# Patient Record
Sex: Male | Born: 1950
Health system: Southern US, Community
[De-identification: ages and names within clinical notes are randomized; demographics above are authoritative.]

## PROBLEM LIST (undated history)

## (undated) DIAGNOSIS — N400 Enlarged prostate without lower urinary tract symptoms: Secondary | ICD-10-CM

## (undated) DIAGNOSIS — J449 Chronic obstructive pulmonary disease, unspecified: Secondary | ICD-10-CM

## (undated) DIAGNOSIS — C801 Malignant (primary) neoplasm, unspecified: Secondary | ICD-10-CM

## (undated) DIAGNOSIS — E119 Type 2 diabetes mellitus without complications: Secondary | ICD-10-CM

## (undated) DIAGNOSIS — I10 Essential (primary) hypertension: Secondary | ICD-10-CM

## (undated) DIAGNOSIS — J302 Other seasonal allergic rhinitis: Secondary | ICD-10-CM

## (undated) DIAGNOSIS — G4730 Sleep apnea, unspecified: Secondary | ICD-10-CM

## (undated) HISTORY — PX: TONSILLECTOMY: SUR1361

## (undated) HISTORY — PX: APPENDECTOMY: SHX54

## (undated) HISTORY — DX: Benign prostatic hyperplasia without lower urinary tract symptoms: N40.0

---

## 2015-07-24 DIAGNOSIS — E119 Type 2 diabetes mellitus without complications: Secondary | ICD-10-CM | POA: Diagnosis not present

## 2015-07-24 DIAGNOSIS — E785 Hyperlipidemia, unspecified: Secondary | ICD-10-CM | POA: Diagnosis not present

## 2015-07-24 DIAGNOSIS — I10 Essential (primary) hypertension: Secondary | ICD-10-CM | POA: Diagnosis not present

## 2015-07-29 DIAGNOSIS — G4733 Obstructive sleep apnea (adult) (pediatric): Secondary | ICD-10-CM | POA: Diagnosis not present

## 2015-07-29 DIAGNOSIS — E669 Obesity, unspecified: Secondary | ICD-10-CM | POA: Diagnosis not present

## 2015-07-29 DIAGNOSIS — E119 Type 2 diabetes mellitus without complications: Secondary | ICD-10-CM | POA: Diagnosis not present

## 2015-07-29 DIAGNOSIS — N400 Enlarged prostate without lower urinary tract symptoms: Secondary | ICD-10-CM | POA: Diagnosis not present

## 2015-07-29 DIAGNOSIS — J309 Allergic rhinitis, unspecified: Secondary | ICD-10-CM | POA: Diagnosis not present

## 2015-07-29 DIAGNOSIS — I10 Essential (primary) hypertension: Secondary | ICD-10-CM | POA: Diagnosis not present

## 2015-08-18 DIAGNOSIS — G4733 Obstructive sleep apnea (adult) (pediatric): Secondary | ICD-10-CM | POA: Diagnosis not present

## 2015-12-02 DIAGNOSIS — I10 Essential (primary) hypertension: Secondary | ICD-10-CM | POA: Diagnosis not present

## 2015-12-02 DIAGNOSIS — E119 Type 2 diabetes mellitus without complications: Secondary | ICD-10-CM | POA: Diagnosis not present

## 2015-12-04 DIAGNOSIS — N400 Enlarged prostate without lower urinary tract symptoms: Secondary | ICD-10-CM | POA: Diagnosis not present

## 2015-12-04 DIAGNOSIS — Z6838 Body mass index (BMI) 38.0-38.9, adult: Secondary | ICD-10-CM | POA: Diagnosis not present

## 2015-12-04 DIAGNOSIS — G4733 Obstructive sleep apnea (adult) (pediatric): Secondary | ICD-10-CM | POA: Diagnosis not present

## 2015-12-04 DIAGNOSIS — I10 Essential (primary) hypertension: Secondary | ICD-10-CM | POA: Diagnosis not present

## 2015-12-04 DIAGNOSIS — J309 Allergic rhinitis, unspecified: Secondary | ICD-10-CM | POA: Diagnosis not present

## 2015-12-04 DIAGNOSIS — E6609 Other obesity due to excess calories: Secondary | ICD-10-CM | POA: Diagnosis not present

## 2015-12-04 DIAGNOSIS — E119 Type 2 diabetes mellitus without complications: Secondary | ICD-10-CM | POA: Diagnosis not present

## 2015-12-30 ENCOUNTER — Ambulatory Visit (HOSPITAL_COMMUNITY)
Admission: RE | Admit: 2015-12-30 | Discharge: 2015-12-30 | Disposition: A | Discharge status: Home / Self Care | Payer: Medicare PPO | Source: Ambulatory Visit | Attending: Internal Medicine | Admitting: Internal Medicine

## 2015-12-30 ENCOUNTER — Other Ambulatory Visit (HOSPITAL_COMMUNITY): Payer: Self-pay | Admitting: Internal Medicine

## 2015-12-30 ENCOUNTER — Encounter (HOSPITAL_COMMUNITY)
Admission: EM | Disposition: A | Discharge status: Home / Self Care | Payer: Self-pay | Source: Home / Self Care | Attending: Neurosurgery

## 2015-12-30 ENCOUNTER — Encounter (HOSPITAL_COMMUNITY): Payer: Self-pay

## 2015-12-30 ENCOUNTER — Other Ambulatory Visit: Payer: Self-pay

## 2015-12-30 ENCOUNTER — Inpatient Hospital Stay (HOSPITAL_COMMUNITY)
Admission: EM | Admit: 2015-12-30 | Discharge: 2016-01-01 | DRG: 027 | Disposition: A | Discharge status: Home / Self Care | Payer: Medicare PPO | Attending: Neurosurgery | Admitting: Neurosurgery

## 2015-12-30 ENCOUNTER — Inpatient Hospital Stay (HOSPITAL_COMMUNITY): Payer: Medicare PPO | Admitting: Critical Care Medicine

## 2015-12-30 DIAGNOSIS — Z23 Encounter for immunization: Secondary | ICD-10-CM

## 2015-12-30 DIAGNOSIS — G935 Compression of brain: Secondary | ICD-10-CM

## 2015-12-30 DIAGNOSIS — R51 Headache: Secondary | ICD-10-CM | POA: Diagnosis not present

## 2015-12-30 DIAGNOSIS — R531 Weakness: Secondary | ICD-10-CM | POA: Insufficient documentation

## 2015-12-30 DIAGNOSIS — R410 Disorientation, unspecified: Secondary | ICD-10-CM

## 2015-12-30 DIAGNOSIS — E119 Type 2 diabetes mellitus without complications: Secondary | ICD-10-CM | POA: Diagnosis not present

## 2015-12-30 DIAGNOSIS — Z029 Encounter for administrative examinations, unspecified: Secondary | ICD-10-CM

## 2015-12-30 DIAGNOSIS — E669 Obesity, unspecified: Secondary | ICD-10-CM | POA: Diagnosis not present

## 2015-12-30 DIAGNOSIS — Z6836 Body mass index (BMI) 36.0-36.9, adult: Secondary | ICD-10-CM | POA: Diagnosis not present

## 2015-12-30 DIAGNOSIS — I6201 Nontraumatic acute subdural hemorrhage: Principal | ICD-10-CM | POA: Diagnosis present

## 2015-12-30 DIAGNOSIS — I10 Essential (primary) hypertension: Secondary | ICD-10-CM | POA: Diagnosis not present

## 2015-12-30 DIAGNOSIS — I6202 Nontraumatic subacute subdural hemorrhage: Secondary | ICD-10-CM

## 2015-12-30 DIAGNOSIS — S065X9A Traumatic subdural hemorrhage with loss of consciousness of unspecified duration, initial encounter: Secondary | ICD-10-CM | POA: Diagnosis present

## 2015-12-30 DIAGNOSIS — R519 Headache, unspecified: Secondary | ICD-10-CM

## 2015-12-30 DIAGNOSIS — S065XAA Traumatic subdural hemorrhage with loss of consciousness status unknown, initial encounter: Secondary | ICD-10-CM | POA: Diagnosis present

## 2015-12-30 DIAGNOSIS — R402412 Glasgow coma scale score 13-15, at arrival to emergency department: Secondary | ICD-10-CM | POA: Diagnosis not present

## 2015-12-30 DIAGNOSIS — I6200 Nontraumatic subdural hemorrhage, unspecified: Secondary | ICD-10-CM | POA: Diagnosis not present

## 2015-12-30 DIAGNOSIS — Z09 Encounter for follow-up examination after completed treatment for conditions other than malignant neoplasm: Secondary | ICD-10-CM

## 2015-12-30 DIAGNOSIS — Z0189 Encounter for other specified special examinations: Secondary | ICD-10-CM | POA: Insufficient documentation

## 2015-12-30 DIAGNOSIS — Z7984 Long term (current) use of oral hypoglycemic drugs: Secondary | ICD-10-CM | POA: Diagnosis not present

## 2015-12-30 DIAGNOSIS — R1110 Vomiting, unspecified: Secondary | ICD-10-CM | POA: Diagnosis not present

## 2015-12-30 HISTORY — DX: Chronic obstructive pulmonary disease, unspecified: J44.9

## 2015-12-30 HISTORY — DX: Sleep apnea, unspecified: G47.30

## 2015-12-30 HISTORY — DX: Essential (primary) hypertension: I10

## 2015-12-30 HISTORY — DX: Type 2 diabetes mellitus without complications: E11.9

## 2015-12-30 HISTORY — DX: Other seasonal allergic rhinitis: J30.2

## 2015-12-30 HISTORY — PX: CRANIOTOMY: SHX93

## 2015-12-30 LAB — BASIC METABOLIC PANEL
ANION GAP: 11 (ref 5–15)
BUN: 12 mg/dL (ref 6–20)
CHLORIDE: 105 mmol/L (ref 101–111)
CO2: 26 mmol/L (ref 22–32)
Calcium: 9.3 mg/dL (ref 8.9–10.3)
Creatinine, Ser: 0.86 mg/dL (ref 0.61–1.24)
GFR calc non Af Amer: >60 mL/min (ref >60)
Glucose, Bld: 159 mg/dL — ABNORMAL HIGH (ref 65–99)
Potassium: 4 mmol/L (ref 3.5–5.1)
Sodium: 142 mmol/L (ref 135–145)

## 2015-12-30 LAB — CBC WITH DIFFERENTIAL/PLATELET
BASOS ABS: 0 10*3/uL (ref 0.0–0.1)
Basophils Relative: 0 %
EOS ABS: 0.1 10*3/uL (ref 0.0–0.7)
EOS PCT: 1 %
HCT: 40.8 % (ref 39.0–52.0)
HEMOGLOBIN: 12.9 g/dL — AB (ref 13.0–17.0)
Lymphocytes Relative: 39 %
Lymphs Abs: 4.2 10*3/uL — ABNORMAL HIGH (ref 0.7–4.0)
MCH: 26.8 pg (ref 26.0–34.0)
MCHC: 31.6 g/dL (ref 30.0–36.0)
MCV: 84.8 fL (ref 78.0–100.0)
Monocytes Absolute: 0.5 10*3/uL (ref 0.1–1.0)
Monocytes Relative: 4 %
Neutro Abs: 5.9 10*3/uL (ref 1.7–7.7)
Neutrophils Relative %: 56 %
PLATELETS: 268 10*3/uL (ref 150–400)
RBC: 4.81 MIL/uL (ref 4.22–5.81)
RDW: 14.4 % (ref 11.5–15.5)
WBC: 10.6 10*3/uL — AB (ref 4.0–10.5)

## 2015-12-30 LAB — TYPE AND SCREEN
ABO/RH(D): O POS
Antibody Screen: NEGATIVE

## 2015-12-30 LAB — ABO/RH: ABO/RH(D): O POS

## 2015-12-30 LAB — PROTIME-INR
INR: 1.14 (ref 0.00–1.49)
Prothrombin Time: 14.8 seconds (ref 11.6–15.2)

## 2015-12-30 LAB — MRSA PCR SCREENING: MRSA BY PCR: POSITIVE — AB

## 2015-12-30 LAB — CBG MONITORING, ED: GLUCOSE-CAPILLARY: 142 mg/dL — AB (ref 65–99)

## 2015-12-30 LAB — TROPONIN I

## 2015-12-30 LAB — POCT I-STAT CREATININE: Creatinine, Ser: 0.9 mg/dL (ref 0.61–1.24)

## 2015-12-30 SURGERY — CRANIOTOMY HEMATOMA EVACUATION SUBDURAL
Anesthesia: General | Site: Head | Laterality: Left

## 2015-12-30 MED ORDER — ESMOLOL HCL 100 MG/10ML IV SOLN
INTRAVENOUS | Status: DC | PRN
  Administered 2015-12-30: 50 mg via INTRAVENOUS

## 2015-12-30 MED ORDER — FENTANYL CITRATE (PF) 250 MCG/5ML IJ SOLN
INTRAMUSCULAR | Status: AC
  Filled 2015-12-30: qty 5

## 2015-12-30 MED ORDER — PROPOFOL 10 MG/ML IV BOLUS
INTRAVENOUS | Status: AC
  Filled 2015-12-30: qty 20

## 2015-12-30 MED ORDER — PANTOPRAZOLE SODIUM 40 MG IV SOLR
40.0000 mg | Freq: Every day | INTRAVENOUS | Status: DC
  Administered 2015-12-30 – 2015-12-31 (×2): 40 mg via INTRAVENOUS
  Filled 2015-12-30 (×2): qty 40

## 2015-12-30 MED ORDER — ROCURONIUM BROMIDE 100 MG/10ML IV SOLN
INTRAVENOUS | Status: DC | PRN
  Administered 2015-12-30: 20 mg via INTRAVENOUS
  Administered 2015-12-30: 50 mg via INTRAVENOUS

## 2015-12-30 MED ORDER — HYDROMORPHONE HCL 1 MG/ML IJ SOLN
0.5000 mg | INTRAMUSCULAR | Status: DC | PRN
  Administered 2015-12-30 – 2015-12-31 (×3): 1 mg via INTRAVENOUS
  Administered 2015-12-31: 0.5 mg via INTRAVENOUS
  Filled 2015-12-30 (×4): qty 1

## 2015-12-30 MED ORDER — OXYCODONE HCL 5 MG/5ML PO SOLN: 5.0000 mg | Freq: Once | ORAL | Status: DC | PRN

## 2015-12-30 MED ORDER — SUGAMMADEX SODIUM 200 MG/2ML IV SOLN
INTRAVENOUS | Status: DC | PRN
  Administered 2015-12-30: 200 mg via INTRAVENOUS

## 2015-12-30 MED ORDER — OXYCODONE HCL 5 MG PO TABS: 5.0000 mg | ORAL_TABLET | Freq: Once | ORAL | Status: DC | PRN

## 2015-12-30 MED ORDER — IOHEXOL 300 MG/ML  SOLN: 75.0000 mL | Freq: Once | INTRAMUSCULAR | Status: DC | PRN

## 2015-12-30 MED ORDER — FLUTICASONE PROPIONATE 50 MCG/ACT NA SUSP
1.0000 | Freq: Every day | NASAL | Status: DC
  Administered 2016-01-01: 1 via NASAL
  Filled 2015-12-30: qty 16

## 2015-12-30 MED ORDER — METFORMIN HCL 500 MG PO TABS: 1000.0000 mg | ORAL_TABLET | Freq: Every day | ORAL | Status: DC

## 2015-12-30 MED ORDER — ONDANSETRON HCL 4 MG/2ML IJ SOLN
4.0000 mg | Freq: Four times a day (QID) | INTRAMUSCULAR | Status: AC | PRN
  Administered 2015-12-30: 4 mg via INTRAVENOUS

## 2015-12-30 MED ORDER — LABETALOL HCL 5 MG/ML IV SOLN: 10.0000 mg | INTRAVENOUS | Status: DC | PRN

## 2015-12-30 MED ORDER — PROPOFOL 10 MG/ML IV BOLUS
INTRAVENOUS | Status: DC | PRN
  Administered 2015-12-30: 150 mg via INTRAVENOUS
  Administered 2015-12-30: 30 mg via INTRAVENOUS

## 2015-12-30 MED ORDER — SODIUM CHLORIDE 0.9 % IV SOLN
500.0000 mg | Freq: Two times a day (BID) | INTRAVENOUS | Status: DC
  Administered 2015-12-30 – 2016-01-01 (×4): 500 mg via INTRAVENOUS
  Filled 2015-12-30 (×5): qty 5

## 2015-12-30 MED ORDER — LIDOCAINE HCL (CARDIAC) 20 MG/ML IV SOLN
INTRAVENOUS | Status: DC | PRN
  Administered 2015-12-30: 100 mg via INTRAVENOUS

## 2015-12-30 MED ORDER — FENTANYL CITRATE (PF) 100 MCG/2ML IJ SOLN
INTRAMUSCULAR | Status: DC | PRN
  Administered 2015-12-30: 150 ug via INTRAVENOUS
  Administered 2015-12-30 (×3): 50 ug via INTRAVENOUS

## 2015-12-30 MED ORDER — LIDOCAINE-EPINEPHRINE 1 %-1:100000 IJ SOLN
INTRAMUSCULAR | Status: DC | PRN
  Administered 2015-12-30: 5 mL

## 2015-12-30 MED ORDER — TAMSULOSIN HCL 0.4 MG PO CAPS
0.4000 mg | ORAL_CAPSULE | Freq: Every day | ORAL | Status: DC
  Administered 2015-12-31 – 2016-01-01 (×2): 0.4 mg via ORAL
  Filled 2015-12-30 (×2): qty 1

## 2015-12-30 MED ORDER — SODIUM CHLORIDE 0.9 % IV SOLN
INTRAVENOUS | Status: DC | PRN
  Administered 2015-12-30: 16:00:00 via INTRAVENOUS

## 2015-12-30 MED ORDER — HYDROCHLOROTHIAZIDE 12.5 MG PO CAPS
12.5000 mg | ORAL_CAPSULE | Freq: Every day | ORAL | Status: DC
  Administered 2015-12-31 – 2016-01-01 (×2): 12.5 mg via ORAL
  Filled 2015-12-30 (×2): qty 1

## 2015-12-30 MED ORDER — VANCOMYCIN HCL 10 G IV SOLR
1250.0000 mg | Freq: Two times a day (BID) | INTRAVENOUS | Status: AC
  Administered 2015-12-31 (×2): 1250 mg via INTRAVENOUS
  Filled 2015-12-30 (×2): qty 1250

## 2015-12-30 MED ORDER — ONDANSETRON HCL 4 MG/2ML IJ SOLN
INTRAMUSCULAR | Status: AC
  Filled 2015-12-30: qty 2

## 2015-12-30 MED ORDER — HYDROCODONE-ACETAMINOPHEN 5-325 MG PO TABS
1.0000 | ORAL_TABLET | ORAL | Status: DC | PRN
  Administered 2015-12-31 – 2016-01-01 (×3): 1 via ORAL
  Filled 2015-12-30 (×3): qty 1

## 2015-12-30 MED ORDER — SODIUM CHLORIDE 0.9 % IV SOLN
INTRAVENOUS | Status: DC
  Administered 2015-12-30 (×2): via INTRAVENOUS

## 2015-12-30 MED ORDER — 0.9 % SODIUM CHLORIDE (POUR BTL) OPTIME
TOPICAL | Status: DC | PRN
  Administered 2015-12-30 (×2): 1000 mL

## 2015-12-30 MED ORDER — ONDANSETRON HCL 4 MG PO TABS: 4.0000 mg | ORAL_TABLET | ORAL | Status: DC | PRN

## 2015-12-30 MED ORDER — ACETAMINOPHEN 325 MG PO TABS: 650.0000 mg | ORAL_TABLET | ORAL | Status: DC | PRN

## 2015-12-30 MED ORDER — ACETAMINOPHEN 650 MG RE SUPP: 650.0000 mg | RECTAL | Status: DC | PRN

## 2015-12-30 MED ORDER — PHENYLEPHRINE HCL 10 MG/ML IJ SOLN
10.0000 mg | INTRAMUSCULAR | Status: DC | PRN
  Administered 2015-12-30: 50 ug/min via INTRAVENOUS

## 2015-12-30 MED ORDER — HYDROMORPHONE HCL 1 MG/ML IJ SOLN: 0.2500 mg | INTRAMUSCULAR | Status: DC | PRN

## 2015-12-30 MED ORDER — POTASSIUM CHLORIDE IN NACL 20-0.9 MEQ/L-% IV SOLN
INTRAVENOUS | Status: DC
  Administered 2015-12-30 – 2016-01-01 (×3): via INTRAVENOUS
  Filled 2015-12-30 (×4): qty 1000

## 2015-12-30 MED ORDER — SUGAMMADEX SODIUM 200 MG/2ML IV SOLN
INTRAVENOUS | Status: AC
  Filled 2015-12-30: qty 2

## 2015-12-30 MED ORDER — SODIUM CHLORIDE 0.9 % IR SOLN
Status: DC | PRN
  Administered 2015-12-30: 17:00:00

## 2015-12-30 MED ORDER — PNEUMOCOCCAL VAC POLYVALENT 25 MCG/0.5ML IJ INJ
0.5000 mL | INJECTION | INTRAMUSCULAR | Status: AC
  Administered 2015-12-31: 0.5 mL via INTRAMUSCULAR
  Filled 2015-12-30: qty 0.5

## 2015-12-30 MED ORDER — THROMBIN 20000 UNITS EX SOLR
CUTANEOUS | Status: DC | PRN
  Administered 2015-12-30: 17:00:00 via TOPICAL

## 2015-12-30 MED ORDER — LORATADINE 10 MG PO TABS
10.0000 mg | ORAL_TABLET | Freq: Every day | ORAL | Status: DC
  Administered 2016-01-01: 10 mg via ORAL
  Filled 2015-12-30 (×2): qty 1

## 2015-12-30 MED ORDER — ONDANSETRON HCL 4 MG/2ML IJ SOLN
4.0000 mg | INTRAMUSCULAR | Status: DC | PRN
  Administered 2015-12-31: 4 mg via INTRAVENOUS
  Filled 2015-12-30: qty 2

## 2015-12-30 MED ORDER — PROMETHAZINE HCL 25 MG PO TABS
12.5000 mg | ORAL_TABLET | ORAL | Status: DC | PRN
  Administered 2015-12-31: 12.5 mg via ORAL
  Filled 2015-12-30: qty 1

## 2015-12-30 MED ORDER — VANCOMYCIN HCL IN DEXTROSE 1-5 GM/200ML-% IV SOLN
INTRAVENOUS | Status: AC
  Administered 2015-12-30: 1000 mg via INTRAVENOUS
  Filled 2015-12-30: qty 200

## 2015-12-30 SURGICAL SUPPLY — 67 items
BAG DECANTER FOR FLEXI CONT (MISCELLANEOUS) ×3 IMPLANT
BANDAGE GAUZE 4  KLING STR (GAUZE/BANDAGES/DRESSINGS) IMPLANT
BIT DRILL WIRE PASS 1.3MM (BIT) ×1 IMPLANT
BLADE CLIPPER SURG (BLADE) IMPLANT
BLADE SURG 11 STRL SS (BLADE) ×3 IMPLANT
BNDG COHESIVE 4X5 TAN NS LF (GAUZE/BANDAGES/DRESSINGS) IMPLANT
BRUSH SCRUB EZ PLAIN DRY (MISCELLANEOUS) ×3 IMPLANT
BUR ACORN 9.0 PRECISION (BURR) ×2 IMPLANT
BUR ACORN 9.0MM PRECISION (BURR) ×1
BUR SPIRAL ROUTER 2.3 (BUR) IMPLANT
BUR SPIRAL ROUTER 2.3MM (BUR)
CANISTER SUCT 3000ML PPV (MISCELLANEOUS) ×3 IMPLANT
CATH ROBINSON RED A/P 14FR (CATHETERS) ×3 IMPLANT
CLIP TI MEDIUM 6 (CLIP) IMPLANT
COVER BACK TABLE 60X90IN (DRAPES) ×6 IMPLANT
DECANTER SPIKE VIAL GLASS SM (MISCELLANEOUS) ×3 IMPLANT
DRAIN CHANNEL 10M FLAT 3/4 FLT (DRAIN) ×3 IMPLANT
DRAPE NEUROLOGICAL W/INCISE (DRAPES) ×3 IMPLANT
DRAPE SURG 17X23 STRL (DRAPES) IMPLANT
DRAPE WARM FLUID 44X44 (DRAPE) ×3 IMPLANT
DRILL WIRE PASS 1.3MM (BIT) ×3
DRSG OPSITE 4X5.5 SM (GAUZE/BANDAGES/DRESSINGS) ×6 IMPLANT
ELECT CAUTERY BLADE 6.4 (BLADE) ×3 IMPLANT
ELECT REM PT RETURN 9FT ADLT (ELECTROSURGICAL) ×3
ELECTRODE REM PT RTRN 9FT ADLT (ELECTROSURGICAL) ×1 IMPLANT
EVACUATOR SILICONE 100CC (DRAIN) ×3 IMPLANT
GAUZE SPONGE 4X4 12PLY STRL (GAUZE/BANDAGES/DRESSINGS) ×3 IMPLANT
GAUZE SPONGE 4X4 16PLY XRAY LF (GAUZE/BANDAGES/DRESSINGS) IMPLANT
GLOVE BIO SURGEON STRL SZ8 (GLOVE) ×3 IMPLANT
GLOVE EXAM NITRILE LRG STRL (GLOVE) IMPLANT
GLOVE EXAM NITRILE MD LF STRL (GLOVE) IMPLANT
GLOVE EXAM NITRILE XL STR (GLOVE) IMPLANT
GLOVE EXAM NITRILE XS STR PU (GLOVE) IMPLANT
GLOVE INDICATOR 8.5 STRL (GLOVE) ×3 IMPLANT
GOWN STRL REUS W/ TWL LRG LVL3 (GOWN DISPOSABLE) ×1 IMPLANT
GOWN STRL REUS W/ TWL XL LVL3 (GOWN DISPOSABLE) ×1 IMPLANT
GOWN STRL REUS W/TWL 2XL LVL3 (GOWN DISPOSABLE) ×3 IMPLANT
GOWN STRL REUS W/TWL LRG LVL3 (GOWN DISPOSABLE) ×2
GOWN STRL REUS W/TWL XL LVL3 (GOWN DISPOSABLE) ×2
HEMOSTAT SURGICEL 2X14 (HEMOSTASIS) IMPLANT
KIT BASIN OR (CUSTOM PROCEDURE TRAY) ×3 IMPLANT
KIT ROOM TURNOVER OR (KITS) ×3 IMPLANT
LIQUID BAND (GAUZE/BANDAGES/DRESSINGS) ×3 IMPLANT
NEEDLE HYPO 25X1 1.5 SAFETY (NEEDLE) ×3 IMPLANT
NS IRRIG 1000ML POUR BTL (IV SOLUTION) ×6 IMPLANT
PACK CRANIOTOMY (CUSTOM PROCEDURE TRAY) ×3 IMPLANT
PAD ARMBOARD 7.5X6 YLW CONV (MISCELLANEOUS) ×9 IMPLANT
PAD EYE OVAL STERILE LF (GAUZE/BANDAGES/DRESSINGS) IMPLANT
PATTIES SURGICAL .25X.25 (GAUZE/BANDAGES/DRESSINGS) IMPLANT
PATTIES SURGICAL .5 X.5 (GAUZE/BANDAGES/DRESSINGS) IMPLANT
PATTIES SURGICAL .5 X3 (DISPOSABLE) IMPLANT
PATTIES SURGICAL 1X1 (DISPOSABLE) IMPLANT
PIN MAYFIELD SKULL DISP (PIN) IMPLANT
SPONGE NEURO XRAY DETECT 1X3 (DISPOSABLE) IMPLANT
SPONGE SURGIFOAM ABS GEL 100 (HEMOSTASIS) ×3 IMPLANT
STAPLER SKIN PROX WIDE 3.9 (STAPLE) ×3 IMPLANT
STAPLER VISISTAT 35W (STAPLE) ×3 IMPLANT
SUT ETHILON 3 0 FSL (SUTURE) IMPLANT
SUT NURALON 4 0 TR CR/8 (SUTURE) ×3 IMPLANT
SUT VIC AB 2-0 CT1 18 (SUTURE) ×3 IMPLANT
SYR CONTROL 10ML LL (SYRINGE) ×3 IMPLANT
TOWEL OR 17X24 6PK STRL BLUE (TOWEL DISPOSABLE) ×3 IMPLANT
TOWEL OR 17X26 10 PK STRL BLUE (TOWEL DISPOSABLE) ×3 IMPLANT
TRAY FOLEY CATH 16FRSI W/METER (SET/KITS/TRAYS/PACK) ×3 IMPLANT
TRAY FOLEY W/METER SILVER 14FR (SET/KITS/TRAYS/PACK) IMPLANT
UNDERPAD 30X30 INCONTINENT (UNDERPADS AND DIAPERS) IMPLANT
WATER STERILE IRR 1000ML POUR (IV SOLUTION) ×3 IMPLANT

## 2015-12-30 NOTE — ED Notes (Signed)
[facility] at bedside 

## 2015-12-30 NOTE — ED Provider Notes (Signed)
CSN: [id number]     Arrival date & time 12/30/15  1322 History   First MD Initiated Contact with Patient 12/30/15 1326     Chief Complaint  Patient presents with  . Headache      HPI Pt was seen at 1335. Per pt, c/o gradual onset and worsening of persistent "headache" for the past 3 weeks. Pt states he has been evaluated by his PMD multiple times for same, and was prescribed various medications that pt states "didn't work" for his headache. Pt states over the past several days he has become more "confused," felt "dizzy," has fallen several times, and has progressive RLE weakness. Denies head injury, no neck or back pain, no abd pain, no N/V/D, no visual changes, no tingling/numbness in extremities, no fevers.    PO: water with meds approximately 0730 this morning Past Medical History  Diagnosis Date  . Diabetes mellitus without complication ([place])   . Hypertension    Past Surgical History  Procedure Laterality Date  . Tonsillectomy    . Appendectomy      Social History  Substance Use Topics  . Smoking status: Never Smoker   . Smokeless tobacco: None  . Alcohol Use: No    Review of Systems ROS: Statement: All systems negative except as marked or noted in the HPI; Constitutional: Negative for fever and chills. ; ; Eyes: Negative for eye pain, redness and discharge. ; ; ENMT: Negative for ear pain, hoarseness, nasal congestion, sinus pressure and sore throat. ; ; Cardiovascular: Negative for chest pain, palpitations, diaphoresis, dyspnea and peripheral edema. ; ; Respiratory: Negative for cough, wheezing and stridor. ; ; Gastrointestinal: Negative for nausea, vomiting, diarrhea, abdominal pain, blood in stool, hematemesis, jaundice and rectal bleeding. . ; ; Genitourinary: Negative for dysuria, flank pain and hematuria. ; ; Musculoskeletal: Negative for back pain and neck pain. Negative for swelling and trauma.; ; Skin: Negative for pruritus, rash, abrasions, blisters, bruising and skin  lesion.; ; Neuro: +headache, confusion, RLE weakness. Negative for lightheadedness and neck stiffness. Negative for altered level of consciousness , altered mental status, paresthesias, involuntary movement, seizure and syncope.      Allergies  Penicillins and Sulfa antibiotics  Home Medications   Prior to Admission medications   Not on File   BP 163/74 mmHg  Pulse 69  Temp(Src) 98.5 F (36.9 C) (Oral)  Resp 14  Ht 5\' 5"  (1.651 m)  Wt 250 lb (113.399 kg)  BMI 41.60 kg/m2  SpO2 100% Physical Exam  1340: Physical examination:  Nursing notes reviewed; Vital signs and O2 SAT reviewed;  Constitutional: Well developed, Well nourished, Well hydrated, In no acute distress; Head:  Normocephalic, atraumatic; Eyes: EOMI, PERRL, No scleral icterus; ENMT: Mouth and pharynx normal, Mucous membranes moist; Neck: Supple, Full range of motion, No lymphadenopathy; Cardiovascular: Regular rate and rhythm, No gallop; Respiratory: Breath sounds clear & equal bilaterally, No wheezes.  Speaking full sentences with ease, Normal respiratory effort/excursion; Chest: Nontender, Movement normal; Abdomen: Soft, Nontender, Nondistended, Normal bowel sounds; Genitourinary: No CVA tenderness; Extremities: Pulses normal, No tenderness, No edema, No calf edema or asymmetry.; Neuro: AA&Ox3, confused re: recent events, names of medications. Major CN grossly intact. No facial droop. Speech clear. Grips equal. Strength 5/5 equal bilat UE's and LLE, strength 4/5 RLE.  DTR 2/4 equal bilat UE's and LE's.  No gross sensory deficits.  Normal cerebellar testing bilat UE's (finger-nose) and LE's (heel-shin). .; Skin: Color normal, Warm, Dry.   ED Course  Procedures (including critical  care time) Labs Review  Imaging Review  I have personally reviewed and evaluated these images and lab results as part of my medical decision-making.   EKG Interpretation None      MDM  MDM Reviewed: previous chart, nursing note and  vitals Reviewed previous: labs and ECG Interpretation: labs, ECG and CT scan Total time providing critical care: 30-74 minutes. This excludes time spent performing separately reportable procedures and services. Consults: neurosurgery (Radiologist)   CRITICAL CARE Performed by: Alfonzo Feller Total critical care time: 35 minutes Critical care time was exclusive of separately billable procedures and treating other patients. Critical care was necessary to treat or prevent imminent or life-threatening deterioration. Critical care was time spent personally by me on the following activities: development of treatment plan with patient and/or surrogate as well as nursing, discussions with consultants, evaluation of patient's response to treatment, examination of patient, obtaining history from patient or surrogate, ordering and performing treatments and interventions, ordering and review of laboratory studies, ordering and review of radiographic studies, pulse oximetry and re-evaluation of patient's condition.  ED ECG REPORT   Date: 12/30/2015  Rate: 65  Rhythm: normal sinus rhythm, premature atrial contractions (PAC) and premature ventricular contractions (PVC)  QRS Axis: normal  Intervals: normal  ST/T Wave abnormalities: normal  Conduction Disutrbances:none  Narrative Interpretation: baseline wander  Old EKG Reviewed: none available   Ct Head Wo Contrast 12/30/2015  CLINICAL DATA:  Sluggishness for 4-5 days, acute non intractable headache, RIGHT-sided weakness, disorientation EXAM: CT HEAD WITHOUT CONTRAST TECHNIQUE: Contiguous axial images were obtained from the base of the skull through the vertex without intravenous contrast. COMPARISON:  None FINDINGS: Large LEFT frontoparietal subdural hematoma measuring up to 2.3 cm thick at the LEFT frontal region, intermediate to high attenuation consistent with acute to subacute age. Significant mass effect upon LEFT hemisphere, greatest at frontal  lobe, with 15 mm of LEFT-to-RIGHT midline shift. Subfalcine herniation identified. Posterior fossa unremarkable. No intra parenchymal hemorrhage, mass lesion, or evidence of acute infarction. No obvious hydrocephalus. Calvaria intact. Visualized paranasal sinuses and mastoid air cells clear. IMPRESSION: Large acute to subacute LEFT frontoparietal subdural hematoma up to 2.3 cm thick at the LEFT frontal region with significant mass effect upon LEFT hemisphere and 15 mm of LEFT-to-RIGHT midline shift. New Subfalcine herniation. Critical Value/emergent results were called by telephone at the time of interpretation on 12/30/2015 at 1310 hrs to Dr. Allyn Kenner , who verbally acknowledged these results. Electronically Signed   By: Lavonia Dana M.D.   On: 12/30/2015 13:23   Results for orders placed or performed during the hospital encounter of 12/30/15  CBC with Differential  Result Value Ref Range   WBC 10.6 (H) 4.0 - 10.5 K/uL   RBC 4.81 4.22 - 5.81 MIL/uL   Hemoglobin 12.9 (L) 13.0 - 17.0 g/dL   HCT 40.8 39.0 - 52.0 %   MCV 84.8 78.0 - 100.0 fL   MCH 26.8 26.0 - 34.0 pg   MCHC 31.6 30.0 - 36.0 g/dL   RDW 14.4 11.5 - 15.5 %   Platelets 268 150 - 400 K/uL   Neutrophils Relative % 56 %   Neutro Abs 5.9 1.7 - 7.7 K/uL   Lymphocytes Relative 39 %   Lymphs Abs 4.2 (H) 0.7 - 4.0 K/uL   Monocytes Relative 4 %   Monocytes Absolute 0.5 0.1 - 1.0 K/uL   Eosinophils Relative 1 %   Eosinophils Absolute 0.1 0.0 - 0.7 K/uL   Basophils Relative 0 %  Basophils Absolute 0.0 0.0 - 0.1 K/uL  Basic metabolic panel  Result Value Ref Range   Sodium 142 135 - 145 mmol/L   Potassium 4.0 3.5 - 5.1 mmol/L   Chloride 105 101 - 111 mmol/L   CO2 26 22 - 32 mmol/L   Glucose, Bld 159 (H) 65 - 99 mg/dL   BUN 12 6 - 20 mg/dL   Creatinine, Ser 0.86 0.61 - 1.24 mg/dL   Calcium 9.3 8.9 - 10.3 mg/dL   GFR calc non Af Amer >60 >60 mL/min   GFR calc Af Amer >60 >60 mL/min   Anion gap 11 5 - 15  Protime-INR  Result Value Ref  Range   Prothrombin Time 14.8 11.6 - 15.2 seconds   INR 1.14 0.00 - 1.49  Troponin I  Result Value Ref Range   Troponin I <0.03 <0.031 ng/mL  CBG monitoring, ED  Result Value Ref Range   Glucose-Capillary 142 (H) 65 - 99 mg/dL    1400:  Pt remains awake/alert, mildly confused. No change in assessment. VSS. T/C to [facility] Neurosurgeon Dr. Saintclair Halsted, case discussed, including:  HPI, pertinent PM/SHx, VS/PE, dx testing, ED course and treatment:  He has viewed the CT images, requests to transfer pt to [facility] Neuro OR holding area. Dx and testing, as well as d/w Neurosurgeon, d/w pt and family.  Questions answered.  Verb understanding, agreeable to transfer/admit to [facility].    Francine Graven, DO 01/01/16 2314

## 2015-12-30 NOTE — Op Note (Signed)
Preoperative diagnosis: Subacute subdural hematoma left  Postoperative diagnosis: Same  Procedure: Trudee Kuster hole craniectomy for evacuation of a subacute subdural hematoma with placement of a subdural drain through 2 bur holes one frontally 1 parietally  Surgeon: Dominica Severin Akansha Wyche  Anesthesia: Gen.  EBL: Minimal  History of present illness: Patient is a 65 year old gentleman has had 3+ weeks of headaches with progressive dizziness unsteadiness and falls patient went to the ER today was evaluated with CT scan of his head which showed a subacute subdural hematoma patient was transferred here we have recommended emergent evacuation of subdural hematoma. I extensively went over the risks and benefits of the operation with the patient as well as perioperative course expectations of outcome and alternatives of surgical surgery with the patient and his wife and daughter.  Operative procedure: Patient brought into the or was induced under general anesthesia positioned supine shoulder bump under his left shoulder his head turned the right with left-sided his head shaved prepped and draped in routine sterile fashion 2 linear incisions were drawn out one frontally 1 parietally they were infiltrated with 5 mL lidocaine with epi and 2 incisions were made and 2 bur holes were drilled. The dura was then coagulated and incised in a cruciate fashion. Immediate dark liquefied blood came out under pressure identified 3 separate membranes that I lysed on the frontal burr hole until I could clearly identify cortex. I further freed up the membrane utilizing a nerve hook and then drilled the posterior parietal bur hole lysed that membrane identify cortex posteriorly irrigated through both bur holes still it was clear irrigant coming through then using a red rubber catheter to further explore that left hemisphere insuring that no further blood products remained. I then passed a 10 [place] Blake drain over a 3 [place] from the posterior  hole to the frontal hole like I could see the drain in the frontal hole to confirm that this was in the right compartment. Then closed both bur holes incisions with interrupted Vicryl and staples hooked up to drain drain was functioning patient was then head was dressed and patient recovered in stable condition. At the end of case all needle counts and sponge counts were correct.

## 2015-12-30 NOTE — Anesthesia Postprocedure Evaluation (Signed)
Anesthesia Post Note  Patient: Mark Huang  Procedure(s) Performed: Procedure(s) (LRB): BURR HOLE HEMATOMA EVACUATION SUBDURAL (Left)  Patient location during evaluation: PACU Anesthesia Type: General Level of consciousness: sedated Pain management: pain level controlled Vital Signs Assessment: post-procedure vital signs reviewed and stable Respiratory status: spontaneous breathing and respiratory function stable Cardiovascular status: stable Anesthetic complications: no    Last Vitals:  Filed Vitals:   12/30/15 1905 12/30/15 2000  BP:  154/66  Pulse: 64 72  Temp:    Resp: 23 16    Last Pain:  Filed Vitals:   12/30/15 2018  PainSc: [place]

## 2015-12-30 NOTE — Anesthesia Procedure Notes (Signed)
Procedure Name: Intubation Date/Time: 12/30/2015 4:26 PM Performed by: Merrilyn Puma B Pre-anesthesia Checklist: Patient identified, Emergency Drugs available, Timeout performed, Suction available and Patient being monitored Patient Re-evaluated:Patient Re-evaluated prior to inductionOxygen Delivery Method: Circle system utilized Preoxygenation: Pre-oxygenation with 100% oxygen Intubation Type: IV induction and Cricoid Pressure applied Ventilation: Mask ventilation without difficulty Laryngoscope Size: 4 and Mac Grade View: Grade II Tube type: Oral Tube size: 8.0 mm Number of attempts: 1 Airway Equipment and Method: Stylet Placement Confirmation: CO2 detector,  positive ETCO2,  ETT inserted through vocal cords under direct vision and breath sounds checked- equal and bilateral Secured at: 24 cm Tube secured with: Tape Dental Injury: Teeth and Oropharynx as per pre-operative assessment

## 2015-12-30 NOTE — Transfer of Care (Signed)
Immediate Anesthesia Transfer of Care Note  Patient: Mark Huang  Procedure(s) Performed: Procedure(s): [place] (Left)  Patient Location: PACU  Anesthesia Type:General  Level of Consciousness: awake, alert , oriented and patient cooperative  Airway & Oxygen Therapy: Patient Spontanous Breathing and Patient connected to face mask oxygen  Post-op Assessment: Report given to RN, Post -op Vital signs reviewed and stable, Patient moving all extremities and Patient moving all extremities X 4  Post vital signs: Reviewed and stable  Last Vitals:  Filed Vitals:   12/30/15 1510 12/30/15 1525  BP: 171/76 152/72  Pulse: 80 64  Temp: 37.2 C   Resp: 18 16    Complications: No apparent anesthesia complications

## 2015-12-30 NOTE — ED Notes (Signed)
Pt reports headache x 3 weeks.  Reports prior to the headache, his pcp had changed his blood sugar medication.  Reports pt called back later and said he couldn't take it and they changed it to another blood sugar medication.  Pt says continued to have a headache.  Pt says has Pt says has fallen twice since having the headaches, last fall was Monday.  Pt says he had not fallen prior to having the headaches.  Pt says he fell Monday because he was dizzy but did not hit head.  Also says when he fell the first time he did not hit his head.  Pt here for out patient CT scan and was told he has a large subdural hematoma.  Critical results were given by Dr. Thornton Papas to Dr. Thurnell Garbe.  Pt presently alert and oriented, c/o headache.

## 2015-12-30 NOTE — Progress Notes (Signed)
Pharmacy Antibiotic Note  Mark Huang is a 65 y.o. male admitted on 12/30/2015 for craniectomy for evacuation of SDH. Pharmacy consulted for vancomycin for surgical prophylaxis x 24 hours.  Pharmacy will sign off, please re-consult as needed.  Plan: -Vancomycin 1250 mg IV q12h x2  Height: 5\' 5"  (165.1 cm) Weight: 218 lb 4.1 oz (99 kg) IBW/kg (Calculated) : 61.5  Temp (24hrs), Avg:98.3 F (36.8 C), Min:97.3 F (36.3 C), Max:99 F (37.2 C)   Recent Labs Lab 12/30/15 1249 12/30/15 1354  WBC  --  10.6*  CREATININE 0.90 0.86    Estimated Creatinine Clearance: 92.7 mL/min (by C-G formula based on Cr of 0.86).    Thank you for allowing pharmacy to be a part of this patient's care.  Mark Huang 12/30/2015 8:09 PM

## 2015-12-30 NOTE — H&P (Signed)
Mark Huang is an 65 y.o. male.   Chief Complaint: Headaches dizziness and falls HPI: Patient is 65 year old gentleman who's had 3 weeks of headaches that have been slow to progress and persistent. Patient had a couple falls recently went to the ER today was evaluated at any pen noted to have a 2 cm subacute subdural and patient been transferred here for evaluation. Currently the patient reports headaches dizziness denies any nausea does report weakness in the right side.  Past Medical History  Diagnosis Date  . Diabetes mellitus without complication ([place])   . Hypertension     Past Surgical History  Procedure Laterality Date  . Tonsillectomy    . Appendectomy      History reviewed. No pertinent family history. Social History:  reports that he has never smoked. He does not have any smokeless tobacco history on file. He reports that he does not drink alcohol or use illicit drugs.  Allergies:  Allergies  Allergen Reactions  . Penicillins     Has patient had a PCN reaction causing immediate rash, facial/tongue/throat swelling, SOB or lightheadedness with hypotension: Yesunknown Has patient had a PCN reaction causing severe rash involving mucus membranes or skin necrosis: Nounknown Has patient had a PCN reaction that required hospitalization Nounknown Has patient had a PCN reaction occurring within the last 10 years: Nono If all of the above answers are "NO", then may proceed with  . Sulfa Antibiotics     Medications Prior to Admission  Medication Sig Dispense Refill  . cetirizine (ZYRTEC) 10 MG tablet Take 10 mg by mouth daily.    . fluticasone (FLONASE) 50 MCG/ACT nasal spray Place 1 spray into both nostrils daily.    . hydrochlorothiazide (MICROZIDE) 12.5 MG capsule Take 12.5 mg by mouth daily.    . metFORMIN (GLUCOPHAGE) 500 MG tablet Take 1,000 mg by mouth daily.    . tamsulosin (FLOMAX) 0.4 MG CAPS capsule Take 0.4 mg by mouth daily.      Results for orders placed or  performed during the hospital encounter of 12/30/15 (from the past 48 hour(s))  CBG monitoring, ED     Status: Abnormal   Collection Time: 12/30/15  1:33 PM  Result Value Ref Range   Glucose-Capillary 142 (H) 65 - 99 mg/dL  CBC with Differential     Status: Abnormal   Collection Time: 12/30/15  1:54 PM  Result Value Ref Range   WBC 10.6 (H) 4.0 - 10.5 K/uL   RBC 4.81 4.22 - 5.81 MIL/uL   Hemoglobin 12.9 (L) 13.0 - 17.0 g/dL   HCT 40.8 39.0 - 52.0 %   MCV 84.8 78.0 - 100.0 fL   MCH 26.8 26.0 - 34.0 pg   MCHC 31.6 30.0 - 36.0 g/dL   RDW 14.4 11.5 - 15.5 %   Platelets 268 150 - 400 K/uL   Neutrophils Relative % 56 %   Neutro Abs 5.9 1.7 - 7.7 K/uL   Lymphocytes Relative 39 %   Lymphs Abs 4.2 (H) 0.7 - 4.0 K/uL   Monocytes Relative 4 %   Monocytes Absolute 0.5 0.1 - 1.0 K/uL   Eosinophils Relative 1 %   Eosinophils Absolute 0.1 0.0 - 0.7 K/uL   Basophils Relative 0 %   Basophils Absolute 0.0 0.0 - 0.1 K/uL  Basic metabolic panel     Status: Abnormal   Collection Time: 12/30/15  1:54 PM  Result Value Ref Range   Sodium 142 135 - 145 mmol/L   Potassium 4.0  3.5 - 5.1 mmol/L   Chloride 105 101 - 111 mmol/L   CO2 26 22 - 32 mmol/L   Glucose, Bld 159 (H) 65 - 99 mg/dL   BUN 12 6 - 20 mg/dL   Creatinine, Ser 0.86 0.61 - 1.24 mg/dL   Calcium 9.3 8.9 - 10.3 mg/dL   GFR calc non Af Amer >60 >60 mL/min   GFR calc Af Amer >60 >60 mL/min    Comment: (NOTE) The eGFR has been calculated using the CKD EPI equation. This calculation has not been validated in all clinical situations. eGFR's persistently <60 mL/min signify possible Chronic Kidney Disease.    Anion gap 11 5 - 15  Protime-INR     Status: None   Collection Time: 12/30/15  1:54 PM  Result Value Ref Range   Prothrombin Time 14.8 11.6 - 15.2 seconds   INR 1.14 0.00 - 1.49  Troponin I     Status: None   Collection Time: 12/30/15  1:54 PM  Result Value Ref Range   Troponin I <0.03 <0.031 ng/mL    Comment:        NO  INDICATION OF MYOCARDIAL INJURY.    Ct Head Wo Contrast  12/30/2015  CLINICAL DATA:  Sluggishness for 4-5 days, acute non intractable headache, RIGHT-sided weakness, disorientation EXAM: CT HEAD WITHOUT CONTRAST TECHNIQUE: Contiguous axial images were obtained from the base of the skull through the vertex without intravenous contrast. COMPARISON:  None FINDINGS: Large LEFT frontoparietal subdural hematoma measuring up to 2.3 cm thick at the LEFT frontal region, intermediate to high attenuation consistent with acute to subacute age. Significant mass effect upon LEFT hemisphere, greatest at frontal lobe, with 15 mm of LEFT-to-RIGHT midline shift. Subfalcine herniation identified. Posterior fossa unremarkable. No intra parenchymal hemorrhage, mass lesion, or evidence of acute infarction. No obvious hydrocephalus. Calvaria intact. Visualized paranasal sinuses and mastoid air cells clear. IMPRESSION: Large acute to subacute LEFT frontoparietal subdural hematoma up to 2.3 cm thick at the LEFT frontal region with significant mass effect upon LEFT hemisphere and 15 mm of LEFT-to-RIGHT midline shift. New Subfalcine herniation. Critical Value/emergent results were called by telephone at the time of interpretation on 12/30/2015 at 1310 hrs to Dr. Allyn Kenner , who verbally acknowledged these results. Electronically Signed   By: Lavonia Dana M.D.   On: 12/30/2015 13:23    Review of Systems  Constitutional: Negative.   Eyes: Negative.   Respiratory: Negative.   Cardiovascular: Negative.   Gastrointestinal: Negative.   Genitourinary: Negative.   Musculoskeletal: Positive for myalgias.  Skin: Negative.   Neurological: Positive for dizziness and headaches.    Blood pressure 171/76, pulse 80, temperature 99 F (37.2 C), temperature source Oral, resp. rate 18, height '5\' 5"'  (1.651 m), weight 99 kg (218 lb 4.1 oz), SpO2 96 %. Physical Exam  Constitutional: He is oriented to person, place, and time. He appears  well-developed and well-nourished.  HENT:  Head: Normocephalic.  Eyes: Pupils are equal, round, and reactive to light.  Neck: Normal range of motion.  Respiratory: Effort normal.  GI: Soft.  Neurological: He is alert and oriented to person, place, and time. He has normal strength. GCS eye subscore is 4. GCS verbal subscore is 5. GCS motor subscore is 6.  Patient is awake alert oriented 3 naming appears to be intact pupils are equal extraocular movements are intact strength is 5 out of 5 he has a slight right pronator drift  Skin: Skin is warm and dry.  Assessment/Plan 65 year old gentleman with a left-sided acute to subacute subdural hematoma. I have extensively talked to the patient and his wife and I recommended taking the OR for attempted burr hole evacuation with possible turning it into a craniotomy. I've gone over the risks and benefits of the procedure with the patient and his wife and they have agreed to proceed forward. I've also discussed perioperative course expectations of outcome and alternatives of surgery and they understand and agree to proceed forward.  Treon Kehl P, MD 12/30/2015, 3:24 PM

## 2015-12-30 NOTE — Anesthesia Preprocedure Evaluation (Addendum)
Anesthesia Evaluation  Patient identified by MRN, date of birth, ID band Patient awake    Reviewed: Allergy & Precautions, NPO status , Patient's Chart, lab work & pertinent test results  Airway Mallampati: II   Neck ROM: full    Dental no notable dental hx. (+) Dental Advisory Given   Pulmonary neg pulmonary ROS,    breath sounds clear to auscultation       Cardiovascular hypertension,  Rhythm:regular Rate:Normal     Neuro/Psych    GI/Hepatic negative GI ROS, Neg liver ROS,   Endo/Other  diabetes, Type 2obese  Renal/GU negative Renal ROS     Musculoskeletal   Abdominal   Peds  Hematology   Anesthesia Other Findings   Reproductive/Obstetrics                            Anesthesia Physical Anesthesia Plan  ASA: II  Anesthesia Plan: General   Post-op Pain Management:    Induction: Intravenous  Airway Management Planned: Oral ETT  Additional Equipment:   Intra-op Plan:   Post-operative Plan: Extubation in OR  Informed Consent: I have reviewed the patients History and Physical, chart, labs and discussed the procedure including the risks, benefits and alternatives for the proposed anesthesia with the patient or authorized representative who has indicated his/her understanding and acceptance.   Dental advisory given  Plan Discussed with: CRNA, Anesthesiologist and Surgeon  Anesthesia Plan Comments:        Anesthesia Quick Evaluation

## 2015-12-31 ENCOUNTER — Encounter (HOSPITAL_COMMUNITY): Payer: Self-pay | Admitting: Neurosurgery

## 2015-12-31 ENCOUNTER — Inpatient Hospital Stay (HOSPITAL_COMMUNITY): Payer: Medicare PPO

## 2015-12-31 DIAGNOSIS — I10 Essential (primary) hypertension: Secondary | ICD-10-CM | POA: Diagnosis not present

## 2015-12-31 DIAGNOSIS — Z23 Encounter for immunization: Secondary | ICD-10-CM | POA: Diagnosis not present

## 2015-12-31 DIAGNOSIS — R402412 Glasgow coma scale score 13-15, at arrival to emergency department: Secondary | ICD-10-CM | POA: Diagnosis not present

## 2015-12-31 DIAGNOSIS — E119 Type 2 diabetes mellitus without complications: Secondary | ICD-10-CM | POA: Diagnosis not present

## 2015-12-31 DIAGNOSIS — E669 Obesity, unspecified: Secondary | ICD-10-CM | POA: Diagnosis not present

## 2015-12-31 DIAGNOSIS — I6201 Nontraumatic acute subdural hemorrhage: Secondary | ICD-10-CM | POA: Diagnosis not present

## 2015-12-31 DIAGNOSIS — Z7984 Long term (current) use of oral hypoglycemic drugs: Secondary | ICD-10-CM | POA: Diagnosis not present

## 2015-12-31 DIAGNOSIS — Z6836 Body mass index (BMI) 36.0-36.9, adult: Secondary | ICD-10-CM | POA: Diagnosis not present

## 2015-12-31 LAB — CBC
HEMATOCRIT: 36.1 % — AB (ref 39.0–52.0)
Hemoglobin: 11.2 g/dL — ABNORMAL LOW (ref 13.0–17.0)
MCH: 25.9 pg — ABNORMAL LOW (ref 26.0–34.0)
MCHC: 31 g/dL (ref 30.0–36.0)
MCV: 83.6 fL (ref 78.0–100.0)
PLATELETS: 259 10*3/uL (ref 150–400)
RBC: 4.32 MIL/uL (ref 4.22–5.81)
RDW: 14.2 % (ref 11.5–15.5)
WBC: 12.7 10*3/uL — AB (ref 4.0–10.5)

## 2015-12-31 LAB — GLUCOSE, CAPILLARY
GLUCOSE-CAPILLARY: 197 mg/dL — AB (ref 65–99)
GLUCOSE-CAPILLARY: 193 mg/dL — AB (ref 65–99)
Glucose-Capillary: 130 mg/dL — ABNORMAL HIGH (ref 65–99)

## 2015-12-31 LAB — BASIC METABOLIC PANEL
Anion gap: 11 (ref 5–15)
BUN: 8 mg/dL (ref 6–20)
CO2: 25 mmol/L (ref 22–32)
CREATININE: 0.85 mg/dL (ref 0.61–1.24)
Calcium: 8.1 mg/dL — ABNORMAL LOW (ref 8.9–10.3)
Chloride: 103 mmol/L (ref 101–111)
GFR calc Af Amer: >60 mL/min (ref >60)
GFR calc non Af Amer: >60 mL/min (ref >60)
GLUCOSE: 174 mg/dL — AB (ref 65–99)
Potassium: 3.8 mmol/L (ref 3.5–5.1)
Sodium: 139 mmol/L (ref 135–145)

## 2015-12-31 MED ORDER — POVIDONE-IODINE 10 % OINT PACKET
TOPICAL_OINTMENT | Freq: Once | CUTANEOUS | Status: AC
  Administered 2015-12-31: 18:00:00 via TOPICAL
  Filled 2015-12-31 (×3): qty 1

## 2015-12-31 MED ORDER — INSULIN ASPART 100 UNIT/ML ~~LOC~~ SOLN: 0.0000 [IU] | Freq: Every day | SUBCUTANEOUS | Status: DC

## 2015-12-31 MED ORDER — MUPIROCIN 2 % EX OINT
1.0000 "application " | TOPICAL_OINTMENT | Freq: Two times a day (BID) | CUTANEOUS | Status: DC
  Administered 2015-12-31 – 2016-01-01 (×3): 1 via NASAL
  Filled 2015-12-31 (×2): qty 22

## 2015-12-31 MED ORDER — CHLORHEXIDINE GLUCONATE CLOTH 2 % EX PADS
6.0000 | MEDICATED_PAD | Freq: Every day | CUTANEOUS | Status: DC
  Administered 2015-12-31 – 2016-01-01 (×2): 6 via TOPICAL

## 2015-12-31 MED ORDER — INSULIN ASPART 100 UNIT/ML ~~LOC~~ SOLN
0.0000 [IU] | Freq: Three times a day (TID) | SUBCUTANEOUS | Status: DC
  Administered 2015-12-31: 4 [IU] via SUBCUTANEOUS
  Administered 2015-12-31: 3 [IU] via SUBCUTANEOUS
  Administered 2016-01-01: 4 [IU] via SUBCUTANEOUS

## 2015-12-31 NOTE — Progress Notes (Signed)
Subjective: Patient reports Doing well right side feels much better minimal headache  Objective: Vital signs in last 24 hours: Temp:  [97.3 F (36.3 C)-99 F (37.2 C)] 98.5 F (36.9 C) (02/16 0400) Pulse Rate:  [57-98] 58 (02/16 0600) Resp:  [12-24] 17 (02/16 0600) BP: (121-171)/(58-94) 159/76 mmHg (02/16 0600) SpO2:  [93 %-100 %] 98 % (02/16 0600) Arterial Line BP: (86-187)/(69-99) 187/78 mmHg (02/16 0500) Weight:  [99 kg (218 lb 4.1 oz)-113.399 kg (250 lb)] 99 kg (218 lb 4.1 oz) (02/15 1510)  Intake/Output from previous day: 02/15 0701 - 02/16 0700 In: 2145 [I.V.:1685; IV Piggyback:460] Out: 2892 [Urine:2285; Emesis/NG output:100; Drains:307; Blood:200] Intake/Output this shift:    Awake alert oriented strength 5 out of 5 no pronator drift  Lab Results:  Recent Labs  12/30/15 1354 12/31/15 0500  WBC 10.6* 12.7*  HGB 12.9* 11.2*  HCT 40.8 36.1*  PLT 268 259   BMET  Recent Labs  12/30/15 1354 12/31/15 0500  NA 142 139  K 4.0 3.8  CL 105 103  CO2 26 25  GLUCOSE 159* 174*  BUN 12 8  CREATININE 0.86 0.85  CALCIUM 9.3 8.1*    Studies/Results: Ct Head Wo Contrast  12/30/2015  CLINICAL DATA:  Sluggishness for 4-5 days, acute non intractable headache, RIGHT-sided weakness, disorientation EXAM: CT HEAD WITHOUT CONTRAST TECHNIQUE: Contiguous axial images were obtained from the base of the skull through the vertex without intravenous contrast. COMPARISON:  None FINDINGS: Large LEFT frontoparietal subdural hematoma measuring up to 2.3 cm thick at the LEFT frontal region, intermediate to high attenuation consistent with acute to subacute age. Significant mass effect upon LEFT hemisphere, greatest at frontal lobe, with 15 mm of LEFT-to-RIGHT midline shift. Subfalcine herniation identified. Posterior fossa unremarkable. No intra parenchymal hemorrhage, mass lesion, or evidence of acute infarction. No obvious hydrocephalus. Calvaria intact. Visualized paranasal sinuses and  mastoid air cells clear. IMPRESSION: Large acute to subacute LEFT frontoparietal subdural hematoma up to 2.3 cm thick at the LEFT frontal region with significant mass effect upon LEFT hemisphere and 15 mm of LEFT-to-RIGHT midline shift. New Subfalcine herniation. Critical Value/emergent results were called by telephone at the time of interpretation on 12/30/2015 at 1310 hrs to Dr. Allyn Kenner , who verbally acknowledged these results. Electronically Signed   By: Lavonia Dana M.D.   On: 12/30/2015 13:23    Assessment/Plan: Awaiting follow-up CT scan will mobilized today with physical and occupational therapy  LOS: 1 day     Theodora Lalanne P 12/31/2015, 7:38 AM

## 2015-12-31 NOTE — Care Management Note (Signed)
Case Management Note  Patient Details  Name: BARNET ANDERER MRN: [id number] Date of Birth: 1951/04/07  Subjective/Objective:    Pt admitted on 12/30/15 s/p Burr holes evacuation of SDH.  PTA, pt independent, lives with spouse.                  Action/Plan: Will follow for discharge planning as pt progresses.  PT/OT evaluations pending.    Expected Discharge Date:                  Expected Discharge Plan:  Home/Self Care  In-House Referral:     Discharge planning Services  CM Consult  Post Acute Care Choice:    Choice offered to:     DME Arranged:    DME Agency:     HH Arranged:    HH Agency:     Status of Service:  In process, will continue to follow  Medicare Important Message Given:    Date Medicare IM Given:    Medicare IM give by:    Date Additional Medicare IM Given:    Additional Medicare Important Message give by:     If discussed at [place] of Stay Meetings, dates discussed:    Additional Comments:  Reinaldo Raddle, RN, BSN  Trauma/Neuro ICU Case Manager [phone]

## 2015-12-31 NOTE — Progress Notes (Signed)
I called CT to schedule A.M. Routine scan. They informed me that they would call to schedule scan when STAT CTs were completed. Will follow up. Thayer Ohm D

## 2016-01-01 ENCOUNTER — Inpatient Hospital Stay (HOSPITAL_COMMUNITY): Payer: Medicare PPO

## 2016-01-01 LAB — GLUCOSE, CAPILLARY
GLUCOSE-CAPILLARY: 147 mg/dL — AB (ref 65–99)
Glucose-Capillary: 154 mg/dL — ABNORMAL HIGH (ref 65–99)

## 2016-01-01 MED ORDER — HYDROCODONE-ACETAMINOPHEN 5-325 MG PO TABS: 1.0000 | ORAL_TABLET | ORAL | Status: DC | PRN

## 2016-01-01 NOTE — Progress Notes (Signed)
Patient ID: Mark Huang, male   DOB: July 06, 1951, 65 y.o.   MRN: [id number] Is doing well no headache significantly improved right-sided strength  Incisions clean dry and intact neurologically nonfocal  Patient is going down for follow-up head CT if that stable patient to be discharged home scheduled follow-up in one week.

## 2016-01-01 NOTE — Evaluation (Signed)
Physical Therapy Evaluation Patient Details Name: Mark Huang MRN: [id number] DOB: 1951-03-29 Today's Date: 01/01/2016   History of Present Illness  pt presents after burr holes for L SDH evacuation.  pt with hx of DM and HTN.    Clinical Impression  Pt moves cautiously and with one small LOB requiring a side step to correct, but no physical A needed.  Pt would benefit from OPPT to maximize independence and decrease fall risk.  Will continue to follow if remains on acute.      Follow Up Recommendations Outpatient PT;Supervision - Intermittent    Equipment Recommendations  None recommended by PT    Recommendations for Other Services       Precautions / Restrictions Precautions Precautions: Fall Restrictions Weight Bearing Restrictions: No      Mobility  Bed Mobility               General bed mobility comments: pt sitting in chair on arrival.  Transfers Overall transfer level: Needs assistance Equipment used: None Transfers: Sit to/from Stand Sit to Stand: Supervision         General transfer comment: pt indicates not feeling normal, but better than he had been feeling.  Denied dizziness.    Ambulation/Gait Ambulation/Gait assistance: Supervision Ambulation Distance (Feet): 150 Feet Assistive device: None Gait Pattern/deviations: Step-through pattern;Decreased stride length     General Gait Details: pt moves slowly and cautiously.  pt did have one small LOB with side step to correct his balance.  No physical A needed.    Stairs            Wheelchair Mobility    Modified Rankin (Stroke Patients Only)       Balance Overall balance assessment: Needs assistance;History of Falls Sitting-balance support: No upper extremity supported;Feet supported Sitting balance-Leahy Scale: Good     Standing balance support: No upper extremity supported;During functional activity Standing balance-Leahy Scale: Fair                                Pertinent Vitals/Pain Pain Assessment: 0-10 Pain Score: 2  Pain Location: Incisional soreness Pain Descriptors / Indicators: Sore Pain Intervention(s): Monitored during session;Premedicated before session;Repositioned    Home Living Family/patient expects to be discharged to:: Private residence Living Arrangements: Spouse/significant other Available Help at Discharge: Family;Available 24 hours/day Type of Home: House Home Access: Stairs to enter Entrance Stairs-Rails: Psychiatric nurse of Steps: 6 Home Layout: One level Home Equipment: Walker - 2 wheels;Cane - single point (They think they have a seat they can use for the shower.)      Prior Function Level of Independence: Independent         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     Hand Dominance        Extremity/Trunk Assessment   Upper Extremity Assessment: Defer to OT evaluation           Lower Extremity Assessment: Overall WFL for tasks assessed      Cervical / Trunk Assessment: Normal  Communication   Communication: No difficulties  Cognition Arousal/Alertness: Awake/alert Behavior During Therapy: WFL for tasks assessed/performed Overall Cognitive Status: Within Functional Limits for tasks assessed                      General Comments      Exercises        Assessment/Plan    PT Assessment  Patient needs continued PT services  PT Diagnosis Difficulty walking   PT Problem List Decreased activity tolerance;Decreased balance;Decreased mobility;Decreased coordination;Decreased knowledge of use of DME;Obesity  PT Treatment Interventions DME instruction;Gait training;Stair training;Functional mobility training;Therapeutic activities;Therapeutic exercise;Balance training;Neuromuscular re-education;Patient/family education   PT Goals (Current goals can be found in the Care Plan section) Acute Rehab PT Goals Patient Stated Goal: Increase activity and  return to work. PT Goal Formulation: With patient Time For Goal Achievement: 01/08/16 Potential to Achieve Goals: Good    Frequency Min 3X/week   Barriers to discharge        Co-evaluation               End of Session Equipment Utilized During Treatment: Gait belt Activity Tolerance: Patient tolerated treatment well Patient left: in chair;with call bell/phone within reach;with family/visitor present;with chair alarm set Nurse Communication: Mobility status         Time: [id number] PT Time Calculation (min) (ACUTE ONLY): 26 min   Charges:   PT Evaluation $PT Eval Moderate Complexity: 1 Procedure PT Treatments $Gait Training: 8-22 mins   PT G CodesCatarina Hartshorn, [place] 01/01/2016, 9:24 AM

## 2016-01-01 NOTE — Evaluation (Signed)
Occupational Therapy Evaluation Patient Details Name: KALOYAN ACREE MRN: [id number] DOB: 09/14/1951 Today's Date: 01/01/2016    History of Present Illness pt presents after burr holes for L SDH evacuation.  pt with hx of DM and HTN.     Clinical Impression    Patient evaluated by Occupational Therapy with no further acute OT needs identified. All education has been completed and the patient has no further questions. Currently, pt is mod I with ADLs.  He presents with very mild cognitive deficits with minimal difficulty with serial subtraction by 7s.  Pt and wife were instructed in safety issues, and he was instructed in activities to challenge cognition.  If cognitive deficits persist, have instructed them to discuss with MD and that he will likely need OPOT.   See below for any follow-up Occupational Therapy or equipment needs. OT is signing off. Thank you for this referral.    Follow Up Recommendations  No OT follow up;Supervision/Assistance - 24 hour    Equipment Recommendations  None recommended by OT (Pt to acquire shower seat )    Recommendations for Other Services       Precautions / Restrictions Precautions Precautions: Fall Restrictions Weight Bearing Restrictions: No      Mobility Bed Mobility               General bed mobility comments: pt sitting in chair on arrival.  Transfers Overall transfer level: Modified independent Equipment used: None Transfers: Sit to/from Stand Sit to Stand: Supervision         General transfer comment: pt indicates not feeling normal, but better than he had been feeling.  Denied dizziness.      Balance Overall balance assessment: Needs assistance;History of Falls Sitting-balance support: No upper extremity supported;Feet supported Sitting balance-Leahy Scale: Good     Standing balance support: No upper extremity supported;During functional activity Standing balance-Leahy Scale: Good                               ADL Overall ADL's : Modified independent                                       General ADL Comments: Pt plans to purchase a shower seat for safety when showering.      Vision Vision Assessment?: Yes Ocular Range of Motion: Within Functional Limits Alignment/Gaze Preference: Within Defined Limits Tracking/Visual Pursuits: Able to track stimulus in all quads without difficulty Saccades: Within functional limits Convergence: Within functional limits   Perception Perception Perception Tested?: Yes   Praxis Praxis Praxis tested?: Within functional limits    Pertinent Vitals/Pain Pain Assessment: No/denies pain Pain Score: 2  Pain Location: Incisional soreness Pain Descriptors / Indicators: Sore Pain Intervention(s): Monitored during session;Premedicated before session;Repositioned     Hand Dominance Right   Extremity/Trunk Assessment Upper Extremity Assessment Upper Extremity Assessment: Overall WFL for tasks assessed   Lower Extremity Assessment Lower Extremity Assessment: Defer to PT evaluation   Cervical / Trunk Assessment Cervical / Trunk Assessment: Normal   Communication Communication Communication: No difficulties   Cognition Arousal/Alertness: Awake/alert Behavior During Therapy: WFL for tasks assessed/performed Overall Cognitive Status: Impaired/Different from baseline Area of Impairment: Problem solving             Problem Solving: Requires verbal cues General Comments: min difficulty and min cues required for serial  subtraction from 100s. Pt acknowledges this is not likely his normal.  He was able to self correct when cued.   Wife does not notice any cognitive deficits.  Discussed with him to challenge his thinking and simulate his work related activities in the coming weeks to challenge his thinking.    General Comments       Exercises       Shoulder Instructions      Home Living Family/patient expects to be  discharged to:: Private residence Living Arrangements: Spouse/significant other Available Help at Discharge: Family;Available 24 hours/day Type of Home: House Home Access: Stairs to enter [employer] of Steps: 6 Entrance Stairs-Rails: Right;Left Home Layout: One level     Bathroom Shower/Tub: Teacher, early years/pre: Standard     Home Equipment: Environmental consultant - 2 wheels;Cane - single point (They think they have a seat they can use for the shower.)          Prior Functioning/Environment Level of Independence: Independent        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    OT Diagnosis:     OT Problem List:     OT Treatment/Interventions:      OT Goals(Current goals can be found in the care plan section) Acute Rehab OT Goals Patient Stated Goal: Increase activity and return to work. OT Goal Formulation: All assessment and education complete, DC therapy  OT Frequency:     Barriers to D/C:            Co-evaluation              End of Session Nurse Communication: Mobility status  Activity Tolerance: Patient tolerated treatment well Patient left: in chair;with call bell/phone within reach;with family/visitor present;with chair alarm set   Time: 1100-1129 OT Time Calculation (min): 29 min Charges:  OT General Charges $OT Visit: 1 Procedure OT Evaluation $OT Eval Moderate Complexity: 1 Procedure OT Treatments $Therapeutic Activity: 8-22 mins G-Codes:    Edoardo Laforte M 01/03/16, 11:49 AM

## 2016-01-01 NOTE — Progress Notes (Signed)
Pt discharging home today with family.  PT recommending OP physical therapy at discharge.  Pt lives in [place], [place], and prefers to do OP therapy in [place], if possible.  Bedside nurse states she will obtain Rx from MD for OP PT; pt will be able to take Rx to any rehab center he desires.  Pt/wife agreeable to this plan.    Reinaldo Raddle, RN, BSN  Trauma/Neuro ICU Case Manager [phone]

## 2016-01-01 NOTE — Discharge Summary (Signed)
  Physician Discharge Summary  Patient ID: Mark Huang MRN: [id number] DOB/AGE: May 11, 1951 65 y.o.  Admit date: 12/30/2015 Discharge date: 01/01/2016  Admission Diagnoses: Subacute subdural hematoma left  Discharge Diagnoses: Same Active Problems:   SDH (subdural hematoma) [facility])   Discharged Condition: good  Hospital Course: Patient admitted hospital through the emergency department underwent emergent burr hole craniectomy for evacuation of a left-sided subacute subdural hematoma. Postoperatively patient did very well recovered in the floor on the floor he was angling and voiding spontaneously significant improvement in headache and right-sided function is drain was removed and posterior day 1 he was observed another 24 hours follow up head CT was stable and patient is stable for discharge home.  Consults: Significant Diagnostic Studies: Treatments: Left-sided bur hole craniectomy for evacuation of subacute subdural hematoma Discharge Exam: Blood pressure 153/99, pulse 73, temperature 97.5 F (36.4 C), temperature source Oral, resp. rate 13, height 5\' 5"  (1.651 m), weight 99 kg (218 lb 4.1 oz), SpO2 99 %. Neurologic nonfocal  Disposition: Home     Medication List    TAKE these medications        amLODipine 10 MG tablet  Commonly known as:  NORVASC  Take 10 mg by mouth daily.     atorvastatin 20 MG tablet  Commonly known as:  LIPITOR  Take 20 mg by mouth daily.     cetirizine 10 MG tablet  Commonly known as:  ZYRTEC  Take 10 mg by mouth daily as needed for allergies.     fluticasone 50 MCG/ACT nasal spray  Commonly known as:  FLONASE  Place 1 spray into both nostrils daily.     hydrochlorothiazide 25 MG tablet  Commonly known as:  HYDRODIURIL  Take 25 mg by mouth daily.     HYDROcodone-acetaminophen 5-325 MG tablet  Commonly known as:  NORCO/VICODIN  Take 1 tablet by mouth every 4 (four) hours as needed for moderate pain.     insulin glargine 100 UNIT/ML  injection  Commonly known as:  LANTUS  Inject 28 Units into the skin daily.     irbesartan 300 MG tablet  Commonly known as:  AVAPRO  Take 300 mg by mouth daily.     metFORMIN 500 MG tablet  Commonly known as:  GLUCOPHAGE  Take 500 mg by mouth 2 (two) times daily with a meal.     tamsulosin 0.4 MG Caps capsule  Commonly known as:  FLOMAX  Take 0.4 mg by mouth at bedtime.           Follow-up Information    Follow up with [facility], MD.   Specialty:  Neurosurgery   Contact information:   [address] [place] [address] [phone]       Signed: Elaina Hoops 01/01/2016, 11:38 AM

## 2016-01-01 NOTE — Discharge Instructions (Signed)
No lifting no bending no twisting no driving a riding a car unless he has come back and forth to see me. Keep incision clean dry and intact. Call for any worsening headache worsening nausea vomiting or weakness on the right side of his body.

## 2016-01-20 DIAGNOSIS — M6281 Muscle weakness (generalized): Secondary | ICD-10-CM | POA: Diagnosis not present

## 2016-01-20 DIAGNOSIS — R2681 Unsteadiness on feet: Secondary | ICD-10-CM | POA: Diagnosis not present

## 2016-01-20 DIAGNOSIS — R262 Difficulty in walking, not elsewhere classified: Secondary | ICD-10-CM | POA: Diagnosis not present

## 2016-01-20 DIAGNOSIS — S065X0D Traumatic subdural hemorrhage without loss of consciousness, subsequent encounter: Secondary | ICD-10-CM | POA: Diagnosis not present

## 2016-01-26 DIAGNOSIS — R262 Difficulty in walking, not elsewhere classified: Secondary | ICD-10-CM | POA: Diagnosis not present

## 2016-01-26 DIAGNOSIS — R2681 Unsteadiness on feet: Secondary | ICD-10-CM | POA: Diagnosis not present

## 2016-01-26 DIAGNOSIS — M6281 Muscle weakness (generalized): Secondary | ICD-10-CM | POA: Diagnosis not present

## 2016-01-26 DIAGNOSIS — S065X0D Traumatic subdural hemorrhage without loss of consciousness, subsequent encounter: Secondary | ICD-10-CM | POA: Diagnosis not present

## 2016-01-28 DIAGNOSIS — S065X0D Traumatic subdural hemorrhage without loss of consciousness, subsequent encounter: Secondary | ICD-10-CM | POA: Diagnosis not present

## 2016-01-28 DIAGNOSIS — R262 Difficulty in walking, not elsewhere classified: Secondary | ICD-10-CM | POA: Diagnosis not present

## 2016-01-28 DIAGNOSIS — R2681 Unsteadiness on feet: Secondary | ICD-10-CM | POA: Diagnosis not present

## 2016-01-28 DIAGNOSIS — M6281 Muscle weakness (generalized): Secondary | ICD-10-CM | POA: Diagnosis not present

## 2016-02-01 DIAGNOSIS — I6200 Nontraumatic subdural hemorrhage, unspecified: Secondary | ICD-10-CM | POA: Diagnosis not present

## 2016-02-09 DIAGNOSIS — S065X0D Traumatic subdural hemorrhage without loss of consciousness, subsequent encounter: Secondary | ICD-10-CM | POA: Diagnosis not present

## 2016-02-09 DIAGNOSIS — M6281 Muscle weakness (generalized): Secondary | ICD-10-CM | POA: Diagnosis not present

## 2016-02-09 DIAGNOSIS — R2681 Unsteadiness on feet: Secondary | ICD-10-CM | POA: Diagnosis not present

## 2016-02-09 DIAGNOSIS — R262 Difficulty in walking, not elsewhere classified: Secondary | ICD-10-CM | POA: Diagnosis not present

## 2016-02-11 DIAGNOSIS — M6281 Muscle weakness (generalized): Secondary | ICD-10-CM | POA: Diagnosis not present

## 2016-02-11 DIAGNOSIS — R2681 Unsteadiness on feet: Secondary | ICD-10-CM | POA: Diagnosis not present

## 2016-02-11 DIAGNOSIS — S065X0D Traumatic subdural hemorrhage without loss of consciousness, subsequent encounter: Secondary | ICD-10-CM | POA: Diagnosis not present

## 2016-02-11 DIAGNOSIS — R262 Difficulty in walking, not elsewhere classified: Secondary | ICD-10-CM | POA: Diagnosis not present

## 2016-02-16 DIAGNOSIS — R262 Difficulty in walking, not elsewhere classified: Secondary | ICD-10-CM | POA: Diagnosis not present

## 2016-02-16 DIAGNOSIS — R2681 Unsteadiness on feet: Secondary | ICD-10-CM | POA: Diagnosis not present

## 2016-02-16 DIAGNOSIS — M6281 Muscle weakness (generalized): Secondary | ICD-10-CM | POA: Diagnosis not present

## 2016-02-16 DIAGNOSIS — S065X0D Traumatic subdural hemorrhage without loss of consciousness, subsequent encounter: Secondary | ICD-10-CM | POA: Diagnosis not present

## 2016-02-18 DIAGNOSIS — S065X0D Traumatic subdural hemorrhage without loss of consciousness, subsequent encounter: Secondary | ICD-10-CM | POA: Diagnosis not present

## 2016-02-18 DIAGNOSIS — R2681 Unsteadiness on feet: Secondary | ICD-10-CM | POA: Diagnosis not present

## 2016-02-18 DIAGNOSIS — R262 Difficulty in walking, not elsewhere classified: Secondary | ICD-10-CM | POA: Diagnosis not present

## 2016-02-18 DIAGNOSIS — M6281 Muscle weakness (generalized): Secondary | ICD-10-CM | POA: Diagnosis not present

## 2016-02-23 DIAGNOSIS — M6281 Muscle weakness (generalized): Secondary | ICD-10-CM | POA: Diagnosis not present

## 2016-02-23 DIAGNOSIS — S065X0D Traumatic subdural hemorrhage without loss of consciousness, subsequent encounter: Secondary | ICD-10-CM | POA: Diagnosis not present

## 2016-02-23 DIAGNOSIS — R2681 Unsteadiness on feet: Secondary | ICD-10-CM | POA: Diagnosis not present

## 2016-02-23 DIAGNOSIS — R262 Difficulty in walking, not elsewhere classified: Secondary | ICD-10-CM | POA: Diagnosis not present

## 2016-02-25 DIAGNOSIS — R2681 Unsteadiness on feet: Secondary | ICD-10-CM | POA: Diagnosis not present

## 2016-02-25 DIAGNOSIS — E1165 Type 2 diabetes mellitus with hyperglycemia: Secondary | ICD-10-CM | POA: Diagnosis not present

## 2016-02-25 DIAGNOSIS — R262 Difficulty in walking, not elsewhere classified: Secondary | ICD-10-CM | POA: Diagnosis not present

## 2016-02-25 DIAGNOSIS — M6281 Muscle weakness (generalized): Secondary | ICD-10-CM | POA: Diagnosis not present

## 2016-02-25 DIAGNOSIS — I10 Essential (primary) hypertension: Secondary | ICD-10-CM | POA: Diagnosis not present

## 2016-02-25 DIAGNOSIS — S065X0D Traumatic subdural hemorrhage without loss of consciousness, subsequent encounter: Secondary | ICD-10-CM | POA: Diagnosis not present

## 2016-02-29 DIAGNOSIS — E782 Mixed hyperlipidemia: Secondary | ICD-10-CM | POA: Diagnosis not present

## 2016-02-29 DIAGNOSIS — I10 Essential (primary) hypertension: Secondary | ICD-10-CM | POA: Diagnosis not present

## 2016-02-29 DIAGNOSIS — E119 Type 2 diabetes mellitus without complications: Secondary | ICD-10-CM | POA: Diagnosis not present

## 2016-02-29 DIAGNOSIS — G4733 Obstructive sleep apnea (adult) (pediatric): Secondary | ICD-10-CM | POA: Diagnosis not present

## 2016-03-03 DIAGNOSIS — S065X0D Traumatic subdural hemorrhage without loss of consciousness, subsequent encounter: Secondary | ICD-10-CM | POA: Diagnosis not present

## 2016-03-03 DIAGNOSIS — R262 Difficulty in walking, not elsewhere classified: Secondary | ICD-10-CM | POA: Diagnosis not present

## 2016-03-03 DIAGNOSIS — M6281 Muscle weakness (generalized): Secondary | ICD-10-CM | POA: Diagnosis not present

## 2016-03-03 DIAGNOSIS — R2681 Unsteadiness on feet: Secondary | ICD-10-CM | POA: Diagnosis not present

## 2017-11-16 DIAGNOSIS — E119 Type 2 diabetes mellitus without complications: Secondary | ICD-10-CM | POA: Diagnosis not present

## 2017-11-16 DIAGNOSIS — E782 Mixed hyperlipidemia: Secondary | ICD-10-CM | POA: Diagnosis not present

## 2017-11-16 DIAGNOSIS — I10 Essential (primary) hypertension: Secondary | ICD-10-CM | POA: Diagnosis not present

## 2017-11-21 DIAGNOSIS — R350 Frequency of micturition: Secondary | ICD-10-CM | POA: Diagnosis not present

## 2017-11-21 DIAGNOSIS — N400 Enlarged prostate without lower urinary tract symptoms: Secondary | ICD-10-CM | POA: Diagnosis not present

## 2017-11-21 DIAGNOSIS — J309 Allergic rhinitis, unspecified: Secondary | ICD-10-CM | POA: Diagnosis not present

## 2017-11-21 DIAGNOSIS — E6609 Other obesity due to excess calories: Secondary | ICD-10-CM | POA: Diagnosis not present

## 2017-11-21 DIAGNOSIS — D72829 Elevated white blood cell count, unspecified: Secondary | ICD-10-CM | POA: Diagnosis not present

## 2017-11-21 DIAGNOSIS — G4733 Obstructive sleep apnea (adult) (pediatric): Secondary | ICD-10-CM | POA: Diagnosis not present

## 2017-11-21 DIAGNOSIS — I10 Essential (primary) hypertension: Secondary | ICD-10-CM | POA: Diagnosis not present

## 2017-11-21 DIAGNOSIS — E119 Type 2 diabetes mellitus without complications: Secondary | ICD-10-CM | POA: Diagnosis not present

## 2017-11-21 DIAGNOSIS — E782 Mixed hyperlipidemia: Secondary | ICD-10-CM | POA: Diagnosis not present

## 2018-01-03 DIAGNOSIS — I10 Essential (primary) hypertension: Secondary | ICD-10-CM | POA: Diagnosis not present

## 2018-01-03 DIAGNOSIS — E119 Type 2 diabetes mellitus without complications: Secondary | ICD-10-CM | POA: Diagnosis not present

## 2018-01-03 DIAGNOSIS — Z713 Dietary counseling and surveillance: Secondary | ICD-10-CM | POA: Diagnosis not present

## 2018-02-13 DIAGNOSIS — G4733 Obstructive sleep apnea (adult) (pediatric): Secondary | ICD-10-CM | POA: Diagnosis not present

## 2018-02-13 DIAGNOSIS — N400 Enlarged prostate without lower urinary tract symptoms: Secondary | ICD-10-CM | POA: Diagnosis not present

## 2018-02-13 DIAGNOSIS — D72829 Elevated white blood cell count, unspecified: Secondary | ICD-10-CM | POA: Diagnosis not present

## 2018-02-13 DIAGNOSIS — I10 Essential (primary) hypertension: Secondary | ICD-10-CM | POA: Diagnosis not present

## 2018-02-13 DIAGNOSIS — Z713 Dietary counseling and surveillance: Secondary | ICD-10-CM | POA: Diagnosis not present

## 2018-02-13 DIAGNOSIS — E782 Mixed hyperlipidemia: Secondary | ICD-10-CM | POA: Diagnosis not present

## 2018-02-13 DIAGNOSIS — Z6833 Body mass index (BMI) 33.0-33.9, adult: Secondary | ICD-10-CM | POA: Diagnosis not present

## 2018-02-13 DIAGNOSIS — E6609 Other obesity due to excess calories: Secondary | ICD-10-CM | POA: Diagnosis not present

## 2018-02-13 DIAGNOSIS — R350 Frequency of micturition: Secondary | ICD-10-CM | POA: Diagnosis not present

## 2018-02-13 DIAGNOSIS — E119 Type 2 diabetes mellitus without complications: Secondary | ICD-10-CM | POA: Diagnosis not present

## 2018-02-15 DIAGNOSIS — N400 Enlarged prostate without lower urinary tract symptoms: Secondary | ICD-10-CM | POA: Diagnosis not present

## 2018-02-15 DIAGNOSIS — E6609 Other obesity due to excess calories: Secondary | ICD-10-CM | POA: Diagnosis not present

## 2018-02-15 DIAGNOSIS — E119 Type 2 diabetes mellitus without complications: Secondary | ICD-10-CM | POA: Diagnosis not present

## 2018-02-15 DIAGNOSIS — J309 Allergic rhinitis, unspecified: Secondary | ICD-10-CM | POA: Diagnosis not present

## 2018-02-15 DIAGNOSIS — D72829 Elevated white blood cell count, unspecified: Secondary | ICD-10-CM | POA: Diagnosis not present

## 2018-02-15 DIAGNOSIS — J060 Acute laryngopharyngitis: Secondary | ICD-10-CM | POA: Diagnosis not present

## 2018-02-15 DIAGNOSIS — R350 Frequency of micturition: Secondary | ICD-10-CM | POA: Diagnosis not present

## 2018-02-15 DIAGNOSIS — I10 Essential (primary) hypertension: Secondary | ICD-10-CM | POA: Diagnosis not present

## 2018-02-15 DIAGNOSIS — G4733 Obstructive sleep apnea (adult) (pediatric): Secondary | ICD-10-CM | POA: Diagnosis not present

## 2018-05-31 DIAGNOSIS — N39498 Other specified urinary incontinence: Secondary | ICD-10-CM | POA: Diagnosis not present

## 2018-05-31 DIAGNOSIS — E6609 Other obesity due to excess calories: Secondary | ICD-10-CM | POA: Diagnosis not present

## 2018-05-31 DIAGNOSIS — I10 Essential (primary) hypertension: Secondary | ICD-10-CM | POA: Diagnosis not present

## 2018-05-31 DIAGNOSIS — E782 Mixed hyperlipidemia: Secondary | ICD-10-CM | POA: Diagnosis not present

## 2018-05-31 DIAGNOSIS — E119 Type 2 diabetes mellitus without complications: Secondary | ICD-10-CM | POA: Diagnosis not present

## 2018-05-31 DIAGNOSIS — G4733 Obstructive sleep apnea (adult) (pediatric): Secondary | ICD-10-CM | POA: Diagnosis not present

## 2018-05-31 DIAGNOSIS — J060 Acute laryngopharyngitis: Secondary | ICD-10-CM | POA: Diagnosis not present

## 2018-05-31 DIAGNOSIS — N400 Enlarged prostate without lower urinary tract symptoms: Secondary | ICD-10-CM | POA: Diagnosis not present

## 2018-05-31 DIAGNOSIS — Z713 Dietary counseling and surveillance: Secondary | ICD-10-CM | POA: Diagnosis not present

## 2018-06-04 DIAGNOSIS — D72829 Elevated white blood cell count, unspecified: Secondary | ICD-10-CM | POA: Diagnosis not present

## 2018-06-04 DIAGNOSIS — E782 Mixed hyperlipidemia: Secondary | ICD-10-CM | POA: Diagnosis not present

## 2018-06-04 DIAGNOSIS — N400 Enlarged prostate without lower urinary tract symptoms: Secondary | ICD-10-CM | POA: Diagnosis not present

## 2018-06-04 DIAGNOSIS — Z713 Dietary counseling and surveillance: Secondary | ICD-10-CM | POA: Diagnosis not present

## 2018-06-04 DIAGNOSIS — G4733 Obstructive sleep apnea (adult) (pediatric): Secondary | ICD-10-CM | POA: Diagnosis not present

## 2018-06-04 DIAGNOSIS — I10 Essential (primary) hypertension: Secondary | ICD-10-CM | POA: Diagnosis not present

## 2018-06-04 DIAGNOSIS — J309 Allergic rhinitis, unspecified: Secondary | ICD-10-CM | POA: Diagnosis not present

## 2018-06-04 DIAGNOSIS — R350 Frequency of micturition: Secondary | ICD-10-CM | POA: Diagnosis not present

## 2018-06-04 DIAGNOSIS — E6609 Other obesity due to excess calories: Secondary | ICD-10-CM | POA: Diagnosis not present

## 2018-06-24 ENCOUNTER — Encounter: Payer: Self-pay | Admitting: Neurology

## 2018-06-25 ENCOUNTER — Encounter: Payer: Self-pay | Admitting: Neurology

## 2018-06-25 ENCOUNTER — Ambulatory Visit: Payer: Medicare PPO | Admitting: Neurology

## 2018-06-25 VITALS — BP 130/80 | HR 64 | Ht 65.0 in | Wt 197.0 lb

## 2018-06-25 DIAGNOSIS — M79672 Pain in left foot: Secondary | ICD-10-CM

## 2018-06-25 DIAGNOSIS — G4733 Obstructive sleep apnea (adult) (pediatric): Secondary | ICD-10-CM

## 2018-06-25 DIAGNOSIS — Z789 Other specified health status: Secondary | ICD-10-CM | POA: Diagnosis not present

## 2018-06-25 DIAGNOSIS — R351 Nocturia: Secondary | ICD-10-CM | POA: Diagnosis not present

## 2018-06-25 DIAGNOSIS — G4719 Other hypersomnia: Secondary | ICD-10-CM

## 2018-06-25 DIAGNOSIS — G2581 Restless legs syndrome: Secondary | ICD-10-CM | POA: Diagnosis not present

## 2018-06-25 DIAGNOSIS — Z9989 Dependence on other enabling machines and devices: Secondary | ICD-10-CM

## 2018-06-25 DIAGNOSIS — E669 Obesity, unspecified: Secondary | ICD-10-CM | POA: Diagnosis not present

## 2018-06-25 DIAGNOSIS — Z820 Family history of epilepsy and other diseases of the nervous system: Secondary | ICD-10-CM | POA: Diagnosis not present

## 2018-06-25 DIAGNOSIS — M79671 Pain in right foot: Secondary | ICD-10-CM | POA: Diagnosis not present

## 2018-06-25 NOTE — Patient Instructions (Signed)

## 2018-06-25 NOTE — Progress Notes (Signed)
Subjective:    Patient ID: Mark Huang is a 67 y.o. male.  HPI     Mark Age, MD, PhD [facility] [address] [place], [place] [address]  Dear Dr. Nevada Crane,   I saw your patient, Mark Huang, upon your kind request in my neurologic clinic today for initial consultation of his sleep disorder, in particular reevaluation of his prior diagnosis of obstructive sleep apnea. The patient is unaccompanied today. As you know, Mark Huang is a 67 year old right-handed gentleman with an underlying medical history of hypertension, hyperlipidemia, suboptimally controlled diabetes, COPD, BPH, seasonal allergies, history of subdural hematoma with status post surgery in 2017, and obesity, who was previously diagnosed with obstructive sleep apnea and placed on CPAP therapy. Sleep study testing was over 10 years ago. Prior sleep study results are not available for my review today. A CPAP download was reviewed today from 05/26/2018 through 06/24/2018, which is a total of 30 days, during which time he used his machine every night with percent used days greater than 4 hours at 100%, indicating superb compliance with an average usage of 7 hours and 57 minutes, residual AHI information is not available, pressure high at 20 cm with EPR of 3. I reviewed your office note from 02/15/2018, which you kindly included. He had blood work in your office in April which I reviewed: CBC with differential showed elevated white cell count at 12.2 with absolute lymphocyte count elevated at 6.8. CMP showed sodium level of 146, otherwise normal findings. Lipid profile showed triglycerides of 200, otherwise unremarkable findings. A1c was 7 on 02/13/2018. He is wondering if his pressure needs to be changed. His machine is also older, [employer]. He reports difficulty with sleep, he goes to sleep okay but does not stay asleep well. He estimates that he only gets about 3-4 hours of sleep on an  average night, has nocturia at least 3 times per average night and also some urinary incontinence overnight. Which he has to wear depends. He does endorse restless leg symptoms and discomfort in his feet including tingling at night, does not like for the sheets to touch his feet. He sleeps with 4 different pillows and different places to accommodate for discomfort at night. His wife is not able to sleep in the same bed with him. He tosses and turns. Bedtime is generally between 8:30 and 9:30, rise time around 5:30 AM. He has been using a [employer] full face mask, size small, could not tolerate a chinstrap in the past. Overall, with time he has been able to lose weight. His original sleep study was probably 25 years ago he estimates. This is his second CPAP machine which is about 68 years old he estimates. His brother had sleep apnea as well, sadly, brother died young around Huang 35 from pancreatic cancer.  His Epworth sleepiness score is 15 out of 24, fatigue score is 46 out of 63. He does not sleep very well. He is a nonsmoker and does not utilize alcohol, drinks caffeine in the form of coffee, 2 cups per day on average. He works at [employer]. He lives with his wife, they have 2 grown children.  His Past Medical History Is Significant For: Past Medical History:  Diagnosis Date  . BPH (benign prostatic hyperplasia)   . COPD (chronic obstructive pulmonary disease) ([place])   . Diabetes mellitus without complication ([place])   . Hypertension   . Seasonal allergies   . Sleep apnea  with use of continuous positive airway pressure (CPAP)    uses CPAP at night     His Past Surgical History Is Significant For: Past Surgical History:  Procedure Laterality Date  . APPENDECTOMY    . CRANIOTOMY Left 12/30/2015   Procedure: BURR HOLE HEMATOMA EVACUATION SUBDURAL;  Surgeon: Kary Kos, MD;  Location: [facility];  Service: Neurosurgery;  Laterality: Left;  . TONSILLECTOMY      His Family History Is  Significant For: No family history on file.  His Social History Is Significant For: Social History   Socioeconomic History  . Marital status: Married    Spouse name: Not on file  . Number of children: Not on file  . Years of education: Not on file  . Highest education level: Not on file  Occupational History  . Not on file  Social Needs  . Financial resource strain: Not on file  . Food insecurity:    Worry: Not on file    Inability: Not on file  . Transportation needs:    Medical: Not on file    Non-medical: Not on file  Tobacco Use  . Smoking status: Never Smoker  . Smokeless tobacco: Never Used  Substance and Sexual Activity  . Alcohol use: No  . Drug use: No  . Sexual activity: Not on file  Lifestyle  . Physical activity:    Days per week: Not on file    Minutes per session: Not on file  . Stress: Not on file  Relationships  . Social connections:    Talks on phone: Not on file    Gets together: Not on file    Attends religious service: Not on file    Active member of club or organization: Not on file    Attends meetings of clubs or organizations: Not on file    Relationship status: Not on file  Other Topics Concern  . Not on file  Social History Narrative  . Not on file    His Allergies Are:  Allergies  Allergen Reactions  . Penicillins     Has patient had a PCN reaction causing immediate rash, facial/tongue/throat swelling, SOB or lightheadedness with hypotension: unknown Has patient had a PCN reaction causing severe rash involving mucus membranes or skin necrosis: unknown Has patient had a PCN reaction that required hospitalization: unknown Has patient had a PCN reaction occurring within the last 10 years: No If all of the above answers are "NO", then may proceed with  . Sulfa Antibiotics   :   His Current Medications Are:  Outpatient Encounter Medications as of 06/25/2018  Medication Sig  . atorvastatin (LIPITOR) 20 MG tablet Take 20 mg by mouth  daily.  . cetirizine (ZYRTEC) 10 MG tablet Take 10 mg by mouth daily as needed for allergies.   . Dapagliflozin-metFORMIN HCl ER (XIGDUO XR) 03-999 MG TB24 Take by mouth.  . Empagliflozin-metFORMIN HCl ER (SYNJARDY XR) 12.03-999 MG TB24 Take by mouth.  . fluticasone (FLONASE) 50 MCG/ACT nasal spray Place 1 spray into both nostrils daily.  . insulin glargine (LANTUS) 100 UNIT/ML injection Inject 25-35 Units into the skin daily.   . irbesartan-hydrochlorothiazide (AVALIDE) 300-12.5 MG tablet Take 1 tablet by mouth daily.  . tamsulosin (FLOMAX) 0.4 MG CAPS capsule Take 0.4 mg by mouth at bedtime.   . [DISCONTINUED] amLODipine (NORVASC) 10 MG tablet Take 10 mg by mouth daily.  . [DISCONTINUED] hydrochlorothiazide (HYDRODIURIL) 25 MG tablet Take 25 mg by mouth daily.  . [DISCONTINUED] HYDROcodone-acetaminophen (  NORCO/VICODIN) 5-325 MG tablet Take 1 tablet by mouth every 4 (four) hours as needed for moderate pain.  . [DISCONTINUED] irbesartan (AVAPRO) 300 MG tablet Take 300 mg by mouth daily.  . [DISCONTINUED] mirabegron ER (MYRBETRIQ) 25 MG TB24 tablet Take 25 mg by mouth daily.   No facility-administered encounter medications on file as of 06/25/2018.   :  Review of Systems:  Out of a complete 14 point review of systems, all are reviewed and negative with the exception of these symptoms as listed below: Review of Systems  Neurological:       Pt presents today to discuss his cpap. Pt has a cpap that is greater than 3 years old. Pt does not have a local DME. Pt is concerned that his sleep may have changed.  Epworth Sleepiness Scale 0= would never doze 1= slight chance of dozing 2= moderate chance of dozing 3= high chance of dozing  Sitting and reading: 3 Watching TV: 3 Sitting inactive in a public place (ex. Theater or meeting): 1 As a passenger in a car for an hour without a break: 2 Lying down to rest in the afternoon: 3 Sitting and talking to someone: 0 Sitting quietly after lunch (no  alcohol): 2 In a car, while stopped in traffic: 1 Total: 15     Objective:  Neurological Exam  Physical Exam Physical Examination:   Vitals:   06/25/18 1334  BP: 130/80  Pulse: 64    General Examination: The patient is a very pleasant 67 y.o. male in no acute distress. He appears well-developed and well-nourished and well groomed.   HEENT: Normocephalic, atraumatic, pupils are equal, round and reactive to light and accommodation. Hearing mildly impaired, R hearing aid in place. Extraocular tracking is good without limitation to gaze excursion or nystagmus noted. Normal smooth pursuit is noted. Face is symmetric with normal facial animation and normal facial sensation. Speech is clear with no dysarthria noted. There is no hypophonia. There is no lip, neck/head, jaw or voice tremor. Neck is supple with full range of passive and active motion. There are no carotid bruits on auscultation. Oropharynx exam reveals: mild mouth dryness, adequate dental hygiene and moderate airway crowding, due to large uvula. Mallampati is class II. Tongue protrudes centrally and palate elevates symmetrically. Tonsils are absent. Neck size is 18.25 inches. He has a Mild overbite. Nasal inspection reveals no significant nasal mucosal bogginess or redness and no septal deviation.   Chest: Clear to auscultation without wheezing, rhonchi or crackles noted.  Heart: S1+S2+0, regular and normal without murmurs, rubs or gallops noted.   Abdomen: Soft, non-tender and non-distended with normal bowel sounds appreciated on auscultation.  Extremities: There is no pitting edema in the distal lower extremities bilaterally. Pedal pulses are intact.  Skin: Warm and dry without trophic changes noted.  Musculoskeletal: exam reveals no obvious joint deformities, tenderness or joint swelling or erythema.   Neurologically:  Mental status: The patient is awake, alert and oriented in all 4 spheres. His immediate and remote  memory, attention, language skills and fund of knowledge are appropriate. There is no evidence of aphasia, agnosia, apraxia or anomia. Speech is clear with normal prosody and enunciation. Thought process is linear. Mood is normal and affect is normal.  Cranial nerves II - XII are as described above under HEENT exam. In addition: shoulder shrug is normal with equal shoulder height noted. Motor exam: Normal bulk, strength and tone is noted. There is no drift, tremor or rebound. Romberg is negative.  Reflexes are 1-2+ throughout. Fine motor skills and coordination: intact with normal finger taps, normal hand movements, normal rapid alternating patting, normal foot taps and normal foot agility.  Cerebellar testing: No dysmetria or intention tremor on finger to nose testing. Heel to shin is unremarkable bilaterally. There is no truncal or gait ataxia.  Sensory exam: intact to light touch, vibration, temperature in the upper and lower extremities.  Gait, station and balance: He stands easily. No veering to one side is noted. No leaning to one side is noted. Posture is Huang-appropriate and stance is narrow based. Gait shows normal stride length and normal pace. No problems turning are noted. Tandem walk is somewhat challenging for him.   Assessment and Plan:   In summary, HAWTHORNE DAY is a very pleasant 67 y.o.-year old male with a history and physical exam concerning for obstructive sleep apnea (OSA). I had a long chat with the patient about my findings and the diagnosis of OSA, its prognosis and treatment options. We talked about medical treatments, surgical interventions and non-pharmacological approaches. I explained in particular the risks and ramifications of untreated moderate to severe OSA, especially with respect to developing cardiovascular disease down the Road, including congestive heart failure, difficult to treat hypertension, cardiac arrhythmias, or stroke. Even type 2 diabetes has, in part, been  linked to untreated OSA. Symptoms of untreated OSA include daytime sleepiness, memory problems, mood irritability and mood disorder such as depression and anxiety, lack of energy, as well as recurrent headaches, especially morning headaches. We talked about trying to maintain a healthy lifestyle in general, as well as the importance of weight control. I encouraged the patient to eat healthy, exercise daily and keep well hydrated, to keep a scheduled bedtime and wake time routine, to not skip any meals and eat healthy snacks in between meals. I advised the patient not to drive when feeling sleepy. I recommended the following at this time: sleep study with potential positive airway pressure titration. (We will score hypopneas at 4%).   I explained the sleep test procedure to the patient and also outlined possible surgical and non-surgical treatment options of OSA, including the use of a custom-made dental device (which would require a referral to a specialist dentist or oral surgeon), upper airway surgical options, such as pillar implants, radiofrequency surgery, tongue base surgery, and UPPP (which would involve a referral to an ENT surgeon). Rarely, jaw surgery such as mandibular advancement may be considered.  I also explained the CPAP treatment option to the patient, who indicated that he would be willing to continue to use CPAP if the need arises. I explained the importance of being compliant with PAP treatment, not only for insurance purposes but primarily to improve His symptoms, and for the patient's long term health benefit, including to reduce His cardiovascular risks. I answered all his questions today and the patient was in agreement. I would like to see him back after the sleep study is completed and encouraged him to call with any interim questions, concerns, problems or updates.   Thank you very much for allowing me to participate in the care of this nice patient. If I can be of any further  assistance to you please do not hesitate to call me at [phone].  Sincerely,   Mark Age, MD, PhD

## 2018-07-03 ENCOUNTER — Telehealth: Payer: Self-pay

## 2018-07-03 DIAGNOSIS — Z9989 Dependence on other enabling machines and devices: Principal | ICD-10-CM

## 2018-07-03 DIAGNOSIS — G4733 Obstructive sleep apnea (adult) (pediatric): Secondary | ICD-10-CM

## 2018-07-03 NOTE — Addendum Note (Signed)
Addended by: Star Age on: 07/03/2018 02:37 PM   Modules accepted: Orders

## 2018-07-03 NOTE — Telephone Encounter (Signed)
Insurance [facility]) will be denying in lab sleep study request.  Dr. Alford Highland with [facility] [phone]) has called for a peer to peer, if you are interested. Due to patient currently being compliant(no matter his other symptoms), they are recommending a HST. How do you want to proceed?

## 2018-07-03 NOTE — Telephone Encounter (Signed)
This patient's insurance denied an attended sleep study. I will order a home sleep test to qualify patient for new equipment.

## 2018-07-04 ENCOUNTER — Telehealth: Payer: Self-pay

## 2018-07-04 NOTE — Telephone Encounter (Signed)
FYI.Marland KitchenMarland KitchenWe rec'd a fax confirmation this morning from [facility] regarding approval for in lab sleep study. Will be cancelling HST order and reactivating the in lab order.

## 2018-07-17 ENCOUNTER — Ambulatory Visit (INDEPENDENT_AMBULATORY_CARE_PROVIDER_SITE_OTHER): Payer: Medicare PPO | Admitting: Neurology

## 2018-07-17 DIAGNOSIS — G2581 Restless legs syndrome: Secondary | ICD-10-CM

## 2018-07-17 DIAGNOSIS — E669 Obesity, unspecified: Secondary | ICD-10-CM

## 2018-07-17 DIAGNOSIS — Z789 Other specified health status: Secondary | ICD-10-CM

## 2018-07-17 DIAGNOSIS — G4719 Other hypersomnia: Secondary | ICD-10-CM

## 2018-07-17 DIAGNOSIS — R351 Nocturia: Secondary | ICD-10-CM

## 2018-07-17 DIAGNOSIS — G4733 Obstructive sleep apnea (adult) (pediatric): Secondary | ICD-10-CM | POA: Diagnosis not present

## 2018-07-17 DIAGNOSIS — M79672 Pain in left foot: Secondary | ICD-10-CM

## 2018-07-17 DIAGNOSIS — G4720 Circadian rhythm sleep disorder, unspecified type: Secondary | ICD-10-CM

## 2018-07-17 DIAGNOSIS — M79671 Pain in right foot: Secondary | ICD-10-CM

## 2018-07-17 DIAGNOSIS — Z820 Family history of epilepsy and other diseases of the nervous system: Secondary | ICD-10-CM

## 2018-07-17 DIAGNOSIS — Z9989 Dependence on other enabling machines and devices: Secondary | ICD-10-CM

## 2018-07-20 ENCOUNTER — Telehealth: Payer: Self-pay | Admitting: *Deleted

## 2018-07-20 NOTE — Telephone Encounter (Signed)
Called, LVM for pt about results per Dr. Rexene Alberts note. Advised he will receive a phone call to schedule second sleep study. Gave [facility] phone number if he has further questions/concerns.

## 2018-07-20 NOTE — Telephone Encounter (Signed)
-----   Message from Star Age, MD sent at 07/20/2018  9:52 AM EDT ----- Patient referred by Dr. [place] Crane, seen by me on 06/25/18, diagnostic PSG on 07/17/18.   Please call and notify the patient that the recent sleep study showed severe obstructive sleep apnea. I recommend treatment for this in the form of CPAP, so we can order the appropriate PAP machine to replace his old. This will require a repeat sleep study for proper titration and mask fitting and correct monitoring of the oxygen saturations. Please explain to patient. I have placed an order in the chart. Thanks.  Star Age, MD, PhD [facility])

## 2018-07-20 NOTE — Addendum Note (Signed)
Addended by: Star Age on: 07/20/2018 09:52 AM   Modules accepted: Orders

## 2018-07-20 NOTE — Progress Notes (Signed)
Patient referred by Dr. Nevada Crane, seen by me on 06/25/18, diagnostic PSG on 07/17/18.   Please call and notify the patient that the recent sleep study showed severe obstructive sleep apnea. I recommend treatment for this in the form of CPAP, so we can order the appropriate PAP machine to replace his old. This will require a repeat sleep study for proper titration and mask fitting and correct monitoring of the oxygen saturations. Please explain to patient. I have placed an order in the chart. Thanks.  Star Age, MD, PhD [facility])

## 2018-07-20 NOTE — Procedures (Signed)
PATIENT'S NAME:  Mark Huang, Mark Huang DOB:      Apr 03, 1951      MR#:    [id number]     DATE OF RECORDING: 07/17/2018 REFERRING M.D.:  Allyn Kenner, MD Study Performed:   Baseline Polysomnogram HISTORY: 67 year old man with a history of hypertension, hyperlipidemia, suboptimally controlled diabetes, COPD, BPH, seasonal allergies, history of subdural hematoma with s/p surgery in 2017, and obesity, who requires re-evaluation of his OSA. His CPAP machine is old and set pressure rather high. He has been compliant with CPAP but needs new equipment. The patient endorsed the Epworth Sleepiness Scale at 15/24 points. The patient's weight 197 pounds with a height of 65 (inches), resulting in a BMI of 32.7 kg/m2. The patient's neck circumference measured 18.2 inches.  CURRENT MEDICATIONS: Lipitor, Zyrtec, Xigduo, Synjardy, Flonase, Lantus, Avalide, Flomax.   PROCEDURE:  This is a multichannel digital polysomnogram utilizing the Somnostar 11.2 system.  Electrodes and sensors were applied and monitored per AASM Specifications.   EEG, EOG, Chin and Limb EMG, were sampled at 200 Hz.  ECG, Snore and Nasal Pressure, Thermal Airflow, Respiratory Effort, CPAP Flow and Pressure, Oximetry was sampled at 50 Hz. Digital video and audio were recorded.      BASELINE STUDY  Lights Out was at 21:13 and Lights On at 05:11.  Total recording time (TRT) was 478.5 minutes, with a total sleep time (TST) of  357.5 minutes.   The patient's sleep latency to consistent sleep was 44.5 minutes. REM latency was delayed. The sleep efficiency was 74.7 %.     SLEEP ARCHITECTURE: WASO (Wake after sleep onset) was 114.5 minutes.  There were 7 minutes in Stage N1, 188.5 minutes Stage N2, 104.5 minutes Stage N3 and 57.5 minutes in Stage REM.  The percentage of Stage N1 was 2.%, Stage N2 was 52.7%, Stage N3 was 29.2% and Stage R (REM sleep) was 16.1%.  The arousals were noted as: 36 were spontaneous, 4 were associated with PLMs, 107 were associated with  respiratory events.  RESPIRATORY ANALYSIS:  There were a total of 225 respiratory events:  61 obstructive apneas, 1 central apneas and 0 mixed apneas with a total of 62 apneas and an apnea index (AI) of 10.4 /hour. There were 163 hypopneas with a hypopnea index of 27.4 /hour. The patient also had 0 respiratory event related arousals (RERAs).      The total APNEA/HYPOPNEA INDEX (AHI) was 37.8/hour and the total RESPIRATORY DISTURBANCE INDEX was  37.8 /hour.  43 events occurred in REM sleep and 259 events in NREM. The REM AHI was 44.9/hour, versus a non-REM AHI of 36.4. The patient spent 317 minutes of total sleep time in the supine position and 41 minutes in non-supine. The supine AHI was 37.3 versus a non-supine AHI of 41.5.  OXYGEN SATURATION & C02:  The Wake baseline 02 saturation was 89%, with the lowest being 78%. Time spent below 89% saturation equaled 107 minutes.  PERIODIC LIMB MOVEMENTS: The patient had a total of 26 Periodic Limb Movements. The Periodic Limb Movement (PLM) index was 4.4 and the PLM Arousal index was .7/hour.   Audio and video analysis did not show any abnormal or unusual movements, behaviors, phonations or vocalizations. The patient took 3 bathroom breaks. Moderate snoring was noted. The EKG was in keeping with normal sinus rhythm (NSR).  Post-study, the patient indicated that sleep was worse usual.   IMPRESSION:  1. Obstructive Sleep Apnea (OSA) 2. Dysfunctions associated with sleep stages or arousals from sleep  RECOMMENDATIONS:  1. This study demonstrates severe obstructive sleep apnea, with a total AHI of 37.8/hour, REM AHI of 44.9/hour, supine AHI of 37.3/hour and O2 nadir of 78%. Treatment with positive airway pressure in the form of CPAP is recommended. This will require a full night titration study to optimize therapy. Other treatment options may include avoidance of supine sleep position along with weight loss, upper airway or jaw surgery in selected patients  or the use of an oral appliance in certain patients. ENT valuation and/or consultation with a maxillofacial surgeon or dentist may be feasible in some instances.    2. Please note that untreated obstructive sleep apnea may carry additional perioperative morbidity. Patients with significant obstructive sleep apnea should receive perioperative PAP therapy and the surgeons and particularly the anesthesiologist should be informed of the diagnosis and the severity of the sleep disordered breathing. 3. This study shows sleep fragmentation and abnormal sleep stage percentages; these are nonspecific findings and per se do not signify an intrinsic sleep disorder or a cause for the patient's sleep-related symptoms. Causes include (but are not limited to) the first night effect of the sleep study, circadian rhythm disturbances, medication effect or an underlying mood disorder or medical problem.  4. The patient should be cautioned not to drive, work at heights, or operate dangerous or heavy equipment when tired or sleepy. Review and reiteration of good sleep hygiene measures should be pursued with any patient. 5. The patient will be seen in follow-up in the sleep clinic at [facility] for discussion of the test results, symptom and treatment compliance review, further management strategies, etc. The referring provider will be notified of the test results.  I certify that I have reviewed the entire raw data recording prior to the issuance of this report in accordance with the Standards of Accreditation of the American Academy of Sleep Medicine (AASM)  Star Age, MD, PhD Diplomat, American Board of Neurology and Sleep Medicine (Neurology and Sleep Medicine)

## 2018-07-23 DIAGNOSIS — H524 Presbyopia: Secondary | ICD-10-CM | POA: Diagnosis not present

## 2018-07-23 DIAGNOSIS — D3132 Benign neoplasm of left choroid: Secondary | ICD-10-CM | POA: Diagnosis not present

## 2018-07-23 DIAGNOSIS — H52223 Regular astigmatism, bilateral: Secondary | ICD-10-CM | POA: Diagnosis not present

## 2018-07-23 DIAGNOSIS — H25813 Combined forms of age-related cataract, bilateral: Secondary | ICD-10-CM | POA: Diagnosis not present

## 2018-07-23 DIAGNOSIS — E119 Type 2 diabetes mellitus without complications: Secondary | ICD-10-CM | POA: Diagnosis not present

## 2018-07-23 DIAGNOSIS — H5213 Myopia, bilateral: Secondary | ICD-10-CM | POA: Diagnosis not present

## 2018-07-24 ENCOUNTER — Telehealth: Payer: Self-pay

## 2018-07-24 NOTE — Telephone Encounter (Signed)
Noted, thank you

## 2018-07-24 NOTE — Telephone Encounter (Signed)
We will get the pt scheduled. We are currently checking with insurance to see if we need P/A

## 2018-07-24 NOTE — Telephone Encounter (Signed)
Have to submit authorization for cpap sleep study. May take up to two weeks. Patient is going out of town at end of September. Can we do an Auto titration since he is a current cpap user?

## 2018-07-27 NOTE — Telephone Encounter (Signed)
Pt should be able to use his old machine until we can get him in for titration. I believe he would benefit from proper titration rather than autoPAP.

## 2018-09-03 DIAGNOSIS — E782 Mixed hyperlipidemia: Secondary | ICD-10-CM | POA: Diagnosis not present

## 2018-09-03 DIAGNOSIS — I10 Essential (primary) hypertension: Secondary | ICD-10-CM | POA: Diagnosis not present

## 2018-09-03 DIAGNOSIS — G4733 Obstructive sleep apnea (adult) (pediatric): Secondary | ICD-10-CM | POA: Diagnosis not present

## 2018-09-03 DIAGNOSIS — E1165 Type 2 diabetes mellitus with hyperglycemia: Secondary | ICD-10-CM | POA: Diagnosis not present

## 2018-09-03 DIAGNOSIS — N400 Enlarged prostate without lower urinary tract symptoms: Secondary | ICD-10-CM | POA: Diagnosis not present

## 2018-09-17 DIAGNOSIS — I10 Essential (primary) hypertension: Secondary | ICD-10-CM | POA: Diagnosis not present

## 2018-09-17 DIAGNOSIS — E1165 Type 2 diabetes mellitus with hyperglycemia: Secondary | ICD-10-CM | POA: Diagnosis not present

## 2018-09-17 DIAGNOSIS — E782 Mixed hyperlipidemia: Secondary | ICD-10-CM | POA: Diagnosis not present

## 2018-09-18 DIAGNOSIS — E782 Mixed hyperlipidemia: Secondary | ICD-10-CM | POA: Diagnosis not present

## 2018-09-18 DIAGNOSIS — I10 Essential (primary) hypertension: Secondary | ICD-10-CM | POA: Diagnosis not present

## 2018-09-18 DIAGNOSIS — E1165 Type 2 diabetes mellitus with hyperglycemia: Secondary | ICD-10-CM | POA: Diagnosis not present

## 2018-09-24 DIAGNOSIS — E1169 Type 2 diabetes mellitus with other specified complication: Secondary | ICD-10-CM | POA: Diagnosis not present

## 2018-09-24 DIAGNOSIS — G4733 Obstructive sleep apnea (adult) (pediatric): Secondary | ICD-10-CM | POA: Diagnosis not present

## 2018-09-24 DIAGNOSIS — E6609 Other obesity due to excess calories: Secondary | ICD-10-CM | POA: Diagnosis not present

## 2018-09-24 DIAGNOSIS — I10 Essential (primary) hypertension: Secondary | ICD-10-CM | POA: Diagnosis not present

## 2018-09-24 DIAGNOSIS — D72829 Elevated white blood cell count, unspecified: Secondary | ICD-10-CM | POA: Diagnosis not present

## 2018-09-24 DIAGNOSIS — N39498 Other specified urinary incontinence: Secondary | ICD-10-CM | POA: Diagnosis not present

## 2018-09-24 DIAGNOSIS — N400 Enlarged prostate without lower urinary tract symptoms: Secondary | ICD-10-CM | POA: Diagnosis not present

## 2018-09-24 DIAGNOSIS — M545 Low back pain: Secondary | ICD-10-CM | POA: Diagnosis not present

## 2018-09-24 DIAGNOSIS — R350 Frequency of micturition: Secondary | ICD-10-CM | POA: Diagnosis not present

## 2018-10-01 ENCOUNTER — Inpatient Hospital Stay (HOSPITAL_COMMUNITY): Payer: Medicare PPO

## 2018-10-01 ENCOUNTER — Encounter (HOSPITAL_COMMUNITY): Payer: Self-pay | Admitting: Internal Medicine

## 2018-10-01 ENCOUNTER — Other Ambulatory Visit: Payer: Self-pay

## 2018-10-01 ENCOUNTER — Inpatient Hospital Stay (HOSPITAL_COMMUNITY): Payer: Medicare PPO | Attending: Internal Medicine | Admitting: Internal Medicine

## 2018-10-01 VITALS — BP 146/71 | HR 64 | Temp 98.7°F | Resp 14 | Wt 202.8 lb

## 2018-10-01 DIAGNOSIS — J449 Chronic obstructive pulmonary disease, unspecified: Secondary | ICD-10-CM | POA: Diagnosis not present

## 2018-10-01 DIAGNOSIS — I10 Essential (primary) hypertension: Secondary | ICD-10-CM | POA: Diagnosis not present

## 2018-10-01 DIAGNOSIS — G4730 Sleep apnea, unspecified: Secondary | ICD-10-CM

## 2018-10-01 DIAGNOSIS — M25559 Pain in unspecified hip: Secondary | ICD-10-CM

## 2018-10-01 DIAGNOSIS — D72829 Elevated white blood cell count, unspecified: Secondary | ICD-10-CM | POA: Diagnosis not present

## 2018-10-01 DIAGNOSIS — Z794 Long term (current) use of insulin: Secondary | ICD-10-CM | POA: Diagnosis not present

## 2018-10-01 DIAGNOSIS — L989 Disorder of the skin and subcutaneous tissue, unspecified: Secondary | ICD-10-CM | POA: Diagnosis not present

## 2018-10-01 DIAGNOSIS — D72828 Other elevated white blood cell count: Secondary | ICD-10-CM

## 2018-10-01 DIAGNOSIS — E1140 Type 2 diabetes mellitus with diabetic neuropathy, unspecified: Secondary | ICD-10-CM | POA: Diagnosis not present

## 2018-10-01 DIAGNOSIS — D751 Secondary polycythemia: Secondary | ICD-10-CM

## 2018-10-01 DIAGNOSIS — Z79899 Other long term (current) drug therapy: Secondary | ICD-10-CM | POA: Insufficient documentation

## 2018-10-01 DIAGNOSIS — N400 Enlarged prostate without lower urinary tract symptoms: Secondary | ICD-10-CM | POA: Insufficient documentation

## 2018-10-01 LAB — IRON AND TIBC
IRON: 55 ug/dL (ref 45–182)
SATURATION RATIOS: 14 % — AB (ref 17.9–39.5)
TIBC: 381 ug/dL (ref 250–450)
UIBC: 326 ug/dL

## 2018-10-01 LAB — CBC WITH DIFFERENTIAL/PLATELET
ABS IMMATURE GRANULOCYTES: 0.05 10*3/uL (ref 0.00–0.07)
BASOS ABS: 0.1 10*3/uL (ref 0.0–0.1)
Basophils Relative: 0 %
Eosinophils Absolute: 0.2 10*3/uL (ref 0.0–0.5)
Eosinophils Relative: 1 %
HEMATOCRIT: 44.3 % (ref 39.0–52.0)
Hemoglobin: 13.9 g/dL (ref 13.0–17.0)
IMMATURE GRANULOCYTES: 0 %
LYMPHS ABS: 13.1 10*3/uL — AB (ref 0.7–4.0)
LYMPHS PCT: 71 %
MCH: 27.6 pg (ref 26.0–34.0)
MCHC: 31.4 g/dL (ref 30.0–36.0)
MCV: 88.1 fL (ref 80.0–100.0)
Monocytes Absolute: 0.7 10*3/uL (ref 0.1–1.0)
Monocytes Relative: 4 %
NEUTROS ABS: 4.6 10*3/uL (ref 1.7–7.7)
Neutrophils Relative %: 24 %
PLATELETS: 243 10*3/uL (ref 150–400)
RBC: 5.03 MIL/uL (ref 4.22–5.81)
RDW: 13.6 % (ref 11.5–15.5)
WBC: 18.7 10*3/uL — ABNORMAL HIGH (ref 4.0–10.5)
nRBC: 0 % (ref 0.0–0.2)

## 2018-10-01 LAB — COMPREHENSIVE METABOLIC PANEL
ALBUMIN: 4.4 g/dL (ref 3.5–5.0)
ALT: 17 U/L (ref 0–44)
ANION GAP: 8 (ref 5–15)
AST: 20 U/L (ref 15–41)
Alkaline Phosphatase: 71 U/L (ref 38–126)
BUN: 14 mg/dL (ref 8–23)
CO2: 26 mmol/L (ref 22–32)
Calcium: 9.5 mg/dL (ref 8.9–10.3)
Chloride: 107 mmol/L (ref 98–111)
Creatinine, Ser: 1.02 mg/dL (ref 0.61–1.24)
GFR calc Af Amer: >60 mL/min (ref >60)
GFR calc non Af Amer: >60 mL/min (ref >60)
GLUCOSE: 104 mg/dL — AB (ref 70–99)
Potassium: 3.8 mmol/L (ref 3.5–5.1)
SODIUM: 141 mmol/L (ref 135–145)
Total Bilirubin: 0.6 mg/dL (ref 0.3–1.2)
Total Protein: 8.1 g/dL (ref 6.5–8.1)

## 2018-10-01 LAB — SEDIMENTATION RATE: SED RATE: 7 mm/h (ref 0–16)

## 2018-10-01 LAB — FERRITIN: Ferritin: 30 ng/mL (ref 24–336)

## 2018-10-01 LAB — LACTATE DEHYDROGENASE: LDH: 134 U/L (ref 98–192)

## 2018-10-01 NOTE — Patient Instructions (Signed)
[facility]  Discharge Instructions: You saw Dr. Higgs today                               _______________________________________________________________  Thank you for choosing [facility] at [facility] to provide your oncology and hematology care.  To afford each patient quality time with our providers, please arrive at least 15 minutes before your scheduled appointment.  You need to re-schedule your appointment if you arrive 10 or more minutes late.  We strive to give you quality time with our providers, and arriving late affects you and other patients whose appointments are after yours.  Also, if you no show three or more times for appointments you may be dismissed from the clinic.  Again, thank you for choosing [facility]. Our hope is that these requests will allow you access to exceptional care and in a timely manner. _______________________________________________________________  If you have questions after your visit, please contact our office at [phone] between the hours of 8:30 a.m. and 5:00 p.m. Voicemails left after 4:30 p.m. will not be returned until the following business day. _______________________________________________________________  For prescription refill requests, have your pharmacy contact our office. _______________________________________________________________  Recommendations made by the consultant and any test results will be sent to your referring physician. _______________________________________________________________ 

## 2018-10-01 NOTE — Progress Notes (Signed)
Referring physician:  Dr. Nevada Crane  Diagnosis Other elevated white blood cell (WBC) count - Plan: CBC with Differential/Platelet, Comprehensive metabolic panel, Lactate dehydrogenase, Ferritin, Iron and TIBC, Transferrin Saturation, Hemochromatosis DNA-PCR(c282y,h63d), Erythropoietin, BCR-ABL1, CML/ALL, PCR, QUANT, JAK2 genotypr, Sedimentation rate, C-reactive protein, Protein electrophoresis, serum, Pathologist smear review  Polycythemia, secondary - Plan: CBC with Differential/Platelet, Comprehensive metabolic panel, Lactate dehydrogenase, Ferritin, Iron and TIBC, Transferrin Saturation, Hemochromatosis DNA-PCR(c282y,h63d), Erythropoietin, BCR-ABL1, CML/ALL, PCR, QUANT, JAK2 genotypr, Sedimentation rate, C-reactive protein, Protein electrophoresis, serum  Pain in joint involving pelvic region and thigh, unspecified laterality - Plan: CBC with Differential/Platelet, Comprehensive metabolic panel, Lactate dehydrogenase, Ferritin, Iron and TIBC, Transferrin Saturation, Hemochromatosis DNA-PCR(c282y,h63d), Erythropoietin, BCR-ABL1, CML/ALL, PCR, QUANT, JAK2 genotypr, Sedimentation rate, C-reactive protein, Protein electrophoresis, serum  Staging Cancer Staging No matching staging information was found for the patient.  Assessment and Plan:  1.  Leucocytosis.  67 year old male referred for evaluation due to leucocytosis.  He denies smoking.  He reports his mother was told in the past she had an elevated WBC and had work-up for leukemia that was reportedly negative.  He denies any Prednisone usage.  He reports he had neurosurgery 3 years ago for a reported bleed.  Since that time he has developed skin lesions on scalp and face and also has one on his chest wall.  He denies taking NSAIDS or ASA currently.  He reports he has been told he has sleep apnea.  He denies any fevers, chills, night sweats and has noted no adenopathy.  Review of chart shows pt had labs done with Dr. Nevada Crane on 02/13/2018 that showed WBC 12.3  HB 13.9 plts 211,000.  He had 56% lymphocytes.  Chemistries WNL with K+ 4.5 Cr 1 and normal LFTs.  Pt is seen today for consultation due to leucocytosis.    Labs were done today 10/01/2018 reviewed and showed WBC 18.7 HB 13.9 plts 243,000.  Review of smear shows elevated lymphocyte count of 71% with smudge cells.  Chemistries WNL with K+ 3.8, Cr 1.02 and normal LFTs.  LDH WNL at 134.   Will ask for formal path review.  Have sent flow cytometry for evaluation.  Pt will RTC to go over results.   He will be notified if any significant abnormalities noted by pathology.  Suspect CLL.  BCR/ABL and Jak 2 pending.    2. Facial erythema.  Pt reports this occurred after he was admitted by Dr. Saintclair Halsted on 12/30/2015 and discharged on 12/31/2017.  At that time pt had a severe headache and was found to have SDH and underwent burr hole craniotomy.  He reports since that time he has developed facial erythema and scalp lesions.  Will check EPO, SPEP, iron studies and hemochromatosis gene evaluation.  HCT is 44.  He will RTC to go over lab results.    3  Skin lesions.  He has scalp and chest wall lesion.  Pt is referred to dermatology.    4. Subdural Hematoma (SDH).  Pt was admitted by Dr. Saintclair Halsted on 12/30/2015 and discharged on 12/31/2017 for SDH and underwent burr hole craniotomy.  He denies any Headache or blurred vision currently.    5.  HTN.  BP 146/71.  Follow-up with PCP.   6.  DM.  Follow-up with PCP.    7.  Neuropathy.  SPEP, CRP, Sed rate pending.  Pt has DM which may also contribute to neuropathy.    8.  Sleep apnea.  Follow-up with Sleep medicine as directed.    [place]  minutes spent with more than 50% spent in review of records, counseling and coordination of care.    HPI:  67 year old male referred for evaluation due to leucocytosis.  He denies smoking.  He reports his mother was told in the past she had an elevated WBC and had workup for leukemia that was reportedly negative.  He denies any Prednisone usage.  He  reports he had neurosurgery 3 years ago for a reported bleed.  Since that time he has developed skin lesions on scalp and face and also has one on his chest wall.  He denies taking NSAIDS or ASA currently.  He reports he has been told he has sleep apnea.  He denies any fevers, chills, night sweats and has noted no adenopathy.  Review of chart shows pt had labs done 02/13/2018 that showed WBC 12.3 HB 13.9 plts 211,000.  He had 56% lymphocytes.  Chemistries WNL with K+ 4.5 Cr 1 and normal LFTs.  Pt is seen today for consultation due to leucocytosis.     Problem List Patient Active Problem List   Diagnosis Date Noted  . SDH (subdural hematoma) ([place]) [S06.5X9A] 12/30/2015    Past Medical History Past Medical History:  Diagnosis Date  . BPH (benign prostatic hyperplasia)   . COPD (chronic obstructive pulmonary disease) ([place])   . Diabetes mellitus without complication ([place])   . Hypertension   . Seasonal allergies   . Sleep apnea with use of continuous positive airway pressure (CPAP)    uses CPAP at night     Past Surgical History Past Surgical History:  Procedure Laterality Date  . APPENDECTOMY    . CRANIOTOMY Left 12/30/2015   Procedure: BURR HOLE HEMATOMA EVACUATION SUBDURAL;  Surgeon: Kary Kos, MD;  Location: [place] [facility];  Service: Neurosurgery;  Laterality: Left;  . TONSILLECTOMY      Family History History reviewed. No pertinent family history.   Social History  reports that he has never smoked. He has never used smokeless tobacco. He reports that he does not drink alcohol or use drugs.  Medications  Current Outpatient Medications:  .  atorvastatin (LIPITOR) 20 MG tablet, Take 20 mg by mouth daily., Disp: , Rfl:  .  cetirizine (ZYRTEC) 10 MG tablet, Take 10 mg by mouth daily as needed for allergies. , Disp: , Rfl:  .  Dapagliflozin-metFORMIN HCl ER (XIGDUO XR) 03-999 MG TB24, Take by mouth., Disp: , Rfl:  .  Empagliflozin-metFORMIN HCl ER (SYNJARDY XR) 12.03-999 MG TB24, Take  by mouth., Disp: , Rfl:  .  fluticasone (FLONASE) 50 MCG/ACT nasal spray, Place 1 spray into both nostrils daily., Disp: , Rfl:  .  insulin glargine (LANTUS) 100 UNIT/ML injection, Inject 25-35 Units into the skin daily. , Disp: , Rfl:  .  irbesartan-hydrochlorothiazide (AVALIDE) 300-12.5 MG tablet, Take 1 tablet by mouth daily., Disp: , Rfl:  .  meloxicam (MOBIC) 15 MG tablet, , Disp: , Rfl: 1 .  tamsulosin (FLOMAX) 0.4 MG CAPS capsule, Take 0.4 mg by mouth at bedtime. , Disp: , Rfl:   Allergies Penicillins and Sulfa antibiotics  Review of Systems Review of Systems - Oncology ROS negative other than neuropathy   Physical Exam  Vitals Wt Readings from Last 3 Encounters:  10/01/18 202 lb 12.8 oz (92 kg)  06/25/18 197 lb (89.4 kg)  12/30/15 218 lb 4.1 oz (99 kg)   Temp Readings from Last 3 Encounters:  10/01/18 98.7 F (37.1 C) (Oral)  01/01/16 99.1 F (37.3 C) (Oral)  BP Readings from Last 3 Encounters:  10/01/18 (!) 146/71  06/25/18 130/80  01/01/16 134/72   Pulse Readings from Last 3 Encounters:  10/01/18 64  06/25/18 64  01/01/16 73   Constitutional: Well-developed, well-nourished, and in no distress.   HENT: Head: Normocephalic and atraumatic.  Mouth/Throat: No oropharyngeal exudate. Mucosa moist. Eyes: Pupils are equal, round, and reactive to light. Conjunctivae are normal. No scleral icterus.  Neck: Normal range of motion. Neck supple. No JVD present.  Cardiovascular: Normal rate, regular rhythm and normal heart sounds.  Exam reveals no gallop and no friction rub.   No murmur heard. Pulmonary/Chest: Effort normal and breath sounds normal. No respiratory distress. No wheezes.No rales.  Abdominal: Soft. Bowel sounds are normal. No distension. There is no tenderness. There is no guarding.  Musculoskeletal: No edema or tenderness.  Lymphadenopathy: No cervical, axillary or supraclavicular adenopathy.  Neurological: Alert and oriented to person, place, and time. No  cranial nerve deficit.  Skin: Facial erythema noted.  Minor skin lesions noted on scalp and face.  He has a nonspecific nodular lesion on right chest wall approx 1 cm.   Psychiatric: Affect and judgment normal.   Labs Appointment on 10/01/2018  Component Date Value Ref Range Status  . WBC 10/01/2018 18.7* 4.0 - 10.5 K/uL Final  . RBC 10/01/2018 5.03  4.22 - 5.81 MIL/uL Final  . Hemoglobin 10/01/2018 13.9  13.0 - 17.0 g/dL Final  . HCT 10/01/2018 44.3  39.0 - 52.0 % Final  . MCV 10/01/2018 88.1  80.0 - 100.0 fL Final  . MCH 10/01/2018 27.6  26.0 - 34.0 pg Final  . MCHC 10/01/2018 31.4  30.0 - 36.0 g/dL Final  . RDW 10/01/2018 13.6  11.5 - 15.5 % Final  . Platelets 10/01/2018 243  150 - 400 K/uL Final  . nRBC 10/01/2018 0.0  0.0 - 0.2 % Final  . Neutrophils Relative % 10/01/2018 24  % Final  . Neutro Abs 10/01/2018 4.6  1.7 - 7.7 K/uL Final  . Lymphocytes Relative 10/01/2018 71  % Final  . Lymphs Abs 10/01/2018 13.1* 0.7 - 4.0 K/uL Final  . Monocytes Relative 10/01/2018 4  % Final  . Monocytes Absolute 10/01/2018 0.7  0.1 - 1.0 K/uL Final  . Eosinophils Relative 10/01/2018 1  % Final  . Eosinophils Absolute 10/01/2018 0.2  0.0 - 0.5 K/uL Final  . Basophils Relative 10/01/2018 0  % Final  . Basophils Absolute 10/01/2018 0.1  0.0 - 0.1 K/uL Final  . WBC Morphology 10/01/2018 ABSOLUTE LYMPHOCYTOSIS   Final  . Immature Granulocytes 10/01/2018 0  % Final  . Abs Immature Granulocytes 10/01/2018 0.05  0.00 - 0.07 K/uL Final  . Reactive, Benign Lymphocytes 10/01/2018 PRESENT   Final  . Smudge Cells 10/01/2018 PRESENT   Final   Performed at [facility], [address], [place],  [address]  . Sodium 10/01/2018 141  135 - 145 mmol/L Final  . Potassium 10/01/2018 3.8  3.5 - 5.1 mmol/L Final  . Chloride 10/01/2018 107  98 - 111 mmol/L Final  . CO2 10/01/2018 26  22 - 32 mmol/L Final  . Glucose, Bld 10/01/2018 104* 70 - 99 mg/dL Final  . BUN 10/01/2018 14  8 - 23 mg/dL Final  .  Creatinine, Ser 10/01/2018 1.02  0.61 - 1.24 mg/dL Final  . Calcium 10/01/2018 9.5  8.9 - 10.3 mg/dL Final  . Total Protein 10/01/2018 8.1  6.5 - 8.1 g/dL Final  . Albumin 10/01/2018 4.4  3.5 -  5.0 g/dL Final  . AST 10/01/2018 20  15 - 41 U/L Final  . ALT 10/01/2018 17  0 - 44 U/L Final  . Alkaline Phosphatase 10/01/2018 71  38 - 126 U/L Final  . Total Bilirubin 10/01/2018 0.6  0.3 - 1.2 mg/dL Final  . GFR calc non Af Amer 10/01/2018 >60  >60 mL/min Final  . GFR calc Af Amer 10/01/2018 >60  >60 mL/min Final   Comment: (NOTE) The eGFR has been calculated using the CKD EPI equation. This calculation has not been validated in all clinical situations. eGFR's persistently <60 mL/min signify possible Chronic Kidney Disease.   Georgiann Hahn gap 10/01/2018 8  5 - 15 Final   Performed at [facility], [address] [place], [place] [address]  . LDH 10/01/2018 134  98 - 192 U/L Final   Performed at [facility], [address], [place], [place] [address]  . Ferritin 10/01/2018 30  24 - 336 ng/mL Final   Performed at [facility], [address], [place], [place] [address]  . Iron 10/01/2018 55  45 - 182 ug/dL Final  . TIBC 10/01/2018 381  250 - 450 ug/dL Final  . Saturation Ratios 10/01/2018 14* 17.9 - 39.5 % Final  . UIBC 10/01/2018 326  ug/dL Final   Performed at [facility], [address], [place], [place] [address]  . Sed Rate 10/01/2018 7  0 - 16 mm/hr Final   Performed at [facility], [address] [place], [place] [address]     Pathology Orders Placed This Encounter  Procedures  . CBC with Differential/Platelet    Standing Status:   Future    Number of Occurrences:   1    Standing Expiration Date:   10/02/2019  . Comprehensive metabolic panel    Standing Status:   Future    Number of Occurrences:   1    Standing Expiration Date:   10/02/2019  . Lactate dehydrogenase    Standing Status:   Future    Number of Occurrences:   1    Standing Expiration Date:   10/02/2019  . Ferritin     Standing Status:   Future    Number of Occurrences:   1    Standing Expiration Date:   10/02/2019  . Iron and TIBC    Standing Status:   Future    Number of Occurrences:   1    Standing Expiration Date:   10/02/2019  . Transferrin Saturation    Standing Status:   Future    Number of Occurrences:   1    Standing Expiration Date:   10/02/2019  . Hemochromatosis DNA-PCR(c282y,h63d)    Standing Status:   Future    Number of Occurrences:   1    Standing Expiration Date:   10/02/2019  . Erythropoietin    Standing Status:   Future    Number of Occurrences:   1    Standing Expiration Date:   10/02/2019  . BCR-ABL1, CML/ALL, PCR, QUANT    Standing Status:   Future    Number of Occurrences:   1    Standing Expiration Date:   10/02/2019  . JAK2 genotypr    Standing Status:   Future    Number of Occurrences:   1    Standing Expiration Date:   10/02/2019  . Sedimentation rate    Standing Status:   Future    Number of Occurrences:   1    Standing Expiration  Date:   10/02/2019  . C-reactive protein    Standing Status:   Future    Number of Occurrences:   1    Standing Expiration Date:   10/02/2019  . Protein electrophoresis, serum    Standing Status:   Future    Number of Occurrences:   1    Standing Expiration Date:   10/02/2019  . Pathologist smear review    lymphocytosis    Standing Status:   Future    Standing Expiration Date:   10/01/2019       Zoila Shutter MD

## 2018-10-02 LAB — PROTEIN ELECTROPHORESIS, SERUM
A/G RATIO SPE: 1.3 (ref 0.7–1.7)
ALPHA-1-GLOBULIN: 0.2 g/dL (ref 0.0–0.4)
Albumin ELP: 4.1 g/dL (ref 2.9–4.4)
Alpha-2-Globulin: 0.9 g/dL (ref 0.4–1.0)
Beta Globulin: 1.1 g/dL (ref 0.7–1.3)
Gamma Globulin: 1 g/dL (ref 0.4–1.8)
Globulin, Total: 3.1 g/dL (ref 2.2–3.9)
TOTAL PROTEIN ELP: 7.2 g/dL (ref 6.0–8.5)

## 2018-10-02 LAB — ERYTHROPOIETIN: Erythropoietin: 15.5 m[IU]/mL (ref 2.6–18.5)

## 2018-10-02 LAB — C-REACTIVE PROTEIN: CRP: 0.9 mg/dL (ref <1.0)

## 2018-10-03 ENCOUNTER — Telehealth (HOSPITAL_COMMUNITY): Payer: Self-pay | Admitting: Internal Medicine

## 2018-10-03 ENCOUNTER — Encounter (HOSPITAL_COMMUNITY): Payer: Self-pay | Admitting: *Deleted

## 2018-10-03 DIAGNOSIS — R509 Fever, unspecified: Secondary | ICD-10-CM

## 2018-10-03 NOTE — Telephone Encounter (Signed)
Spoke with pt regarding Flow cytometry results that are consistent with CLL.    Pt informed WBC 18.7 HB 13.9  and plts > 243,000.  Due to some of his symptoms, he will be set up for CT CAP for evaluation for any evidence of adenopathy.  He will RTC to go over results.  I have discussed with him he is showing no evidence of anemia or thrombocytopenia so currently observation recommended.  He will RTC to go over scan results and for follow-up.

## 2018-10-03 NOTE — Progress Notes (Signed)
I spoke with Maudie Mercury in pathology and ordered CLL FISH on accession # [id number].

## 2018-10-04 ENCOUNTER — Other Ambulatory Visit (HOSPITAL_COMMUNITY): Payer: Self-pay | Admitting: *Deleted

## 2018-10-04 ENCOUNTER — Inpatient Hospital Stay (HOSPITAL_COMMUNITY): Payer: Medicare PPO

## 2018-10-04 DIAGNOSIS — N400 Enlarged prostate without lower urinary tract symptoms: Secondary | ICD-10-CM | POA: Diagnosis not present

## 2018-10-04 DIAGNOSIS — D72828 Other elevated white blood cell count: Secondary | ICD-10-CM

## 2018-10-04 DIAGNOSIS — E1140 Type 2 diabetes mellitus with diabetic neuropathy, unspecified: Secondary | ICD-10-CM | POA: Diagnosis not present

## 2018-10-04 DIAGNOSIS — Z79899 Other long term (current) drug therapy: Secondary | ICD-10-CM | POA: Diagnosis not present

## 2018-10-04 DIAGNOSIS — I10 Essential (primary) hypertension: Secondary | ICD-10-CM | POA: Diagnosis not present

## 2018-10-04 DIAGNOSIS — G4730 Sleep apnea, unspecified: Secondary | ICD-10-CM | POA: Diagnosis not present

## 2018-10-04 DIAGNOSIS — J449 Chronic obstructive pulmonary disease, unspecified: Secondary | ICD-10-CM | POA: Diagnosis not present

## 2018-10-04 DIAGNOSIS — D72829 Elevated white blood cell count, unspecified: Secondary | ICD-10-CM | POA: Diagnosis not present

## 2018-10-04 DIAGNOSIS — L989 Disorder of the skin and subcutaneous tissue, unspecified: Secondary | ICD-10-CM | POA: Diagnosis not present

## 2018-10-04 DIAGNOSIS — Z794 Long term (current) use of insulin: Secondary | ICD-10-CM | POA: Diagnosis not present

## 2018-10-04 LAB — BCR-ABL1, CML/ALL, PCR, QUANT

## 2018-10-04 LAB — HEMOCHROMATOSIS DNA-PCR(C282Y,H63D)

## 2018-10-04 NOTE — Progress Notes (Signed)
Created in error

## 2018-10-05 LAB — JAK2 GENOTYPR

## 2018-10-07 ENCOUNTER — Ambulatory Visit (INDEPENDENT_AMBULATORY_CARE_PROVIDER_SITE_OTHER): Payer: Medicare PPO | Admitting: Neurology

## 2018-10-07 DIAGNOSIS — E669 Obesity, unspecified: Secondary | ICD-10-CM

## 2018-10-07 DIAGNOSIS — G4733 Obstructive sleep apnea (adult) (pediatric): Secondary | ICD-10-CM | POA: Diagnosis not present

## 2018-10-07 DIAGNOSIS — G4719 Other hypersomnia: Secondary | ICD-10-CM

## 2018-10-07 DIAGNOSIS — R351 Nocturia: Secondary | ICD-10-CM

## 2018-10-07 DIAGNOSIS — G4720 Circadian rhythm sleep disorder, unspecified type: Secondary | ICD-10-CM

## 2018-10-07 DIAGNOSIS — Z789 Other specified health status: Secondary | ICD-10-CM

## 2018-10-07 DIAGNOSIS — G4761 Periodic limb movement disorder: Secondary | ICD-10-CM

## 2018-10-15 DIAGNOSIS — D225 Melanocytic nevi of trunk: Secondary | ICD-10-CM | POA: Diagnosis not present

## 2018-10-15 DIAGNOSIS — C4359 Malignant melanoma of other part of trunk: Secondary | ICD-10-CM | POA: Diagnosis not present

## 2018-10-15 DIAGNOSIS — L821 Other seborrheic keratosis: Secondary | ICD-10-CM | POA: Diagnosis not present

## 2018-10-15 DIAGNOSIS — L570 Actinic keratosis: Secondary | ICD-10-CM | POA: Diagnosis not present

## 2018-10-15 DIAGNOSIS — X32XXXA Exposure to sunlight, initial encounter: Secondary | ICD-10-CM | POA: Diagnosis not present

## 2018-10-16 ENCOUNTER — Telehealth: Payer: Self-pay

## 2018-10-16 NOTE — Telephone Encounter (Signed)
-----   Message from Star Age, MD sent at 10/16/2018  7:41 AM EST ----- Patient referred by Dr. Nevada Crane, seen by me on 06/25/18, diagnostic PSG on 07/17/18. Patient had a CPAP titration study on 10/07/18.  Please call and inform patient that I have entered an order for treatment with positive airway pressure (PAP) treatment for obstructive sleep apnea (OSA). He did well during the latest sleep study with CPAP. We will, therefore, arrange for a new machine and new supplies for home use through a DME (durable medical equipment) company of His choice; he may have one already. I will see the patient back in follow-up in about 10 weeks. Please also explain to the patient that I will be looking out for compliance data, which can be downloaded from the machine (stored on an SD card, that is inserted in the machine) or via remote access through a modem, that is built into the machine. At the time of the followup appointment we will discuss sleep study results and how it is going with PAP treatment at home. Please advise patient to bring His machine at the time of the first FU visit, even though this is cumbersome. Bringing the machine for every visit after that will likely not be needed, but often helps for the first visit to troubleshoot if needed. Please re-enforce the importance of compliance with treatment and the need for [place] to monitor compliance data - often an insurance requirement and actually good feedback for the patient as far as how they are doing.  Also remind patient, that any interim PAP machine or mask issues should be first addressed with the DME company, as they can often help better with technical and mask fit issues. Please ask if patient has a preference regarding DME company. He may have an existing DME.   Please also make sure, the patient has a follow-up appointment with me in about 10 weeks from the setup date, thanks. May see one of our nurse practitioners if needed for proper timing of the FU  appointment.  Please fax or rout report to the referring provider. Thanks,   Star Age, MD, PhD [facility])

## 2018-10-16 NOTE — Progress Notes (Signed)
Patient referred by Dr. Nevada Crane, seen by me on 06/25/18, diagnostic PSG on 07/17/18. Patient had a CPAP titration study on 10/07/18.  Please call and inform patient that I have entered an order for treatment with positive airway pressure (PAP) treatment for obstructive sleep apnea (OSA). He did well during the latest sleep study with CPAP. We will, therefore, arrange for a new machine and new supplies for home use through a DME (durable medical equipment) company of His choice; he may have one already. I will see the patient back in follow-up in about 10 weeks. Please also explain to the patient that I will be looking out for compliance data, which can be downloaded from the machine (stored on an SD card, that is inserted in the machine) or via remote access through a modem, that is built into the machine. At the time of the followup appointment we will discuss sleep study results and how it is going with PAP treatment at home. Please advise patient to bring His machine at the time of the first FU visit, even though this is cumbersome. Bringing the machine for every visit after that will likely not be needed, but often helps for the first visit to troubleshoot if needed. Please re-enforce the importance of compliance with treatment and the need for [place] to monitor compliance data - often an insurance requirement and actually good feedback for the patient as far as how they are doing.  Also remind patient, that any interim PAP machine or mask issues should be first addressed with the DME company, as they can often help better with technical and mask fit issues. Please ask if patient has a preference regarding DME company. He may have an existing DME.   Please also make sure, the patient has a follow-up appointment with me in about 10 weeks from the setup date, thanks. May see one of our nurse practitioners if needed for proper timing of the FU appointment.  Please fax or rout report to the referring provider. Thanks,    Star Age, MD, PhD [facility])

## 2018-10-16 NOTE — Procedures (Signed)
PATIENT'S NAME:  Mark Huang, Mark Huang DOB:      [birth year]      MR#:    [id number]     DATE OF RECORDING: 10/07/2018 REFERRING M.D.:  Allyn Kenner, MD Study Performed:   CPAP  Titration HISTORY: 67 year old man with a history of hypertension, hyperlipidemia, suboptimally controlled diabetes, COPD, BPH, seasonal allergies, history of subdural hematoma with s/p surgery in 2017, and obesity, who presents for a full night titration study to treat his OSA. His baseline sleep study for re-evaluation of OSA from 07/17/18 showed severe obstructive sleep apnea, with a total AHI of 37.8/hour, REM AHI of 44.9/hour, supine AHI of 37.3/hour and O2 nadir of 78%. The patient endorsed the Epworth Sleepiness Scale at 15/24 points, BMI of 32.7 kg/m2. The patient's neck circumference measured 18.25 inches.  CURRENT MEDICATIONS: Lipitor, Zyrtec, Xigduo, Synjardy, Flonase, Lantus, Avalide, Flomax.  PROCEDURE:  This is a multichannel digital polysomnogram utilizing the SomnoStar 11.2 system.  Electrodes and sensors were applied and monitored per AASM Specifications.   EEG, EOG, Chin and Limb EMG, were sampled at 200 Hz.  ECG, Snore and Nasal Pressure, Thermal Airflow, Respiratory Effort, CPAP Flow and Pressure, Oximetry was sampled at 50 Hz. Digital video and audio were recorded.      The patient was fitted with his usual mask: small Mirage Quattro. CPAP was initiated at 5 cmH20 with heated humidity per AASM standards and pressure was advanced to 12 cmH20 because of hypopneas, apneas and desaturations. At a PAP pressure of 12 cmH20, there was a reduction of the AHI to 1.4/hour with brief O2 nadir of 85% during supine REM sleep.   Lights Out was at 21:56 and Lights On at 05:16. Total recording time (TRT) was 441 minutes, with a total sleep time (TST) of 388.5 minutes. The patient's sleep latency to persistent sleep was 40.5 minutes. REM latency was 107.5 minutes. The sleep efficiency was 88.1 %.    SLEEP ARCHITECTURE: WASO (Wake  after sleep onset) was 44.5 minutes with one longer period of wakefulness. There were 8 minutes in Stage N1, 201.5 minutes Stage N2, 101 minutes Stage N3 and 78 minutes in Stage REM.  The percentage of Stage N1 was 2.1%, Stage N2 was 51.9%, Stage N3 was 26.% and Stage R (REM sleep) was 20.1%, which is normal.   RESPIRATORY ANALYSIS:  There was a total of 58 respiratory events: 5 obstructive apneas, 3 central apneas and 0 mixed apneas with a total of 8 apneas and an apnea index (AI) of 1.2 /hour. There were 50 hypopneas with a hypopnea index of 7.7/hour. The patient also had 0 respiratory event related arousals (RERAs).      The total APNEA/HYPOPNEA INDEX (AHI) was 9. /hour and the total RESPIRATORY DISTURBANCE INDEX was 9. /hour  4 events occurred in REM sleep and 54 events in NREM. The REM AHI was 3.1 /hour versus a non-REM AHI of 10.4 /hour.  The patient spent 214 minutes of total sleep time in the supine position and 175 minutes in non-supine. The supine AHI was 13.4, versus a non-supine AHI of 3.4.  OXYGEN SATURATION & C02:  The baseline 02 saturation was 94%, with the lowest being 85%. Time spent below 89% saturation equaled 12 minutes.  PERIODIC LIMB MOVEMENTS:  The patient had a total of 124 Periodic Limb Movements. The Periodic Limb Movement (PLM) index was 19.2 and the PLM Arousal index was 1.1 /hour.   Audio and video analysis did not show any abnormal or unusual movements,  behaviors, phonations or vocalizations. The patient took 1 bathroom break for the night. The EKG was in keeping with normal sinus rhythm.  Post-study, the patient indicated that sleep was better than usual.   DIAGNOSIS:  1. Obstructive Sleep Apnea  2. Periodic Limb Movement Disorder   RECOMMENDATIONS:   1. This study demonstrates significant improvement of the patient's obstructive sleep apnea with CPAP therapy. Due to the O2 nadir of 85% on the final titration pressure of 12 cm, I will recommend a home CPAP  treatment pressure of 14 cm via small FFM with heated humidity. The patient should be reminded to be fully compliant with PAP therapy to improve sleep related symptoms and decrease long term cardiovascular risks. The patient should be reminded, that it may take up to 3 months to get fully used to using PAP with all planned sleep. The earlier full compliance is achieved, the better long term compliance tends to be. Please note that untreated obstructive sleep apnea may carry additional perioperative morbidity. Patients with significant obstructive sleep apnea should receive perioperative PAP therapy and the surgeons and particularly the anesthesiologist should be informed of the diagnosis and the severity of the sleep disordered breathing. 2. Mild PLMs (periodic limb movements of sleep) were noted during this study with no significant arousals; clinical correlation is recommended.  3. The patient should be cautioned not to drive, work at heights, or operate dangerous or heavy equipment when tired or sleepy. Review and reiteration of good sleep hygiene measures should be pursued with any patient. 4. The patient will be seen in follow-up in the sleep clinic at [facility] for discussion of the test results, symptom and treatment compliance review, further management strategies, etc. The referring provider will be notified of the test results.   I certify that I have reviewed the entire raw data recording prior to the issuance of this report in accordance with the Standards of Accreditation of the American Academy of Sleep Medicine (AASM)   Star Age, MD, PhD Diplomat, American Board of Neurology and Sleep Medicine (Neurology and Sleep Medicine)

## 2018-10-16 NOTE — Addendum Note (Signed)
Addended by: Star Age on: 10/16/2018 07:41 AM   Modules accepted: Orders

## 2018-10-16 NOTE — Telephone Encounter (Signed)
I called pt. I advised pt that Dr. Rexene Alberts reviewed their sleep study results and found that pt start a new cpap at home. Dr. Rexene Alberts recommends that pt start a cpap at home. I reviewed PAP compliance expectations with the pt. Pt is agreeable to starting a CPAP. I advised pt that an order will be sent to a DME, [place], and [place] [facility] will call the pt within about one week after they file with the pt's insurance. [facility] will show the pt how to use the machine, fit for masks, and troubleshoot the CPAP if needed. A follow up appt was made for insurance purposes with Hoyle Sauer, NP on 01/17/19 at 3:15pm. Pt verbalized understanding to arrive 15 minutes early and bring their CPAP. A letter with all of this information in it will be mailed to the pt as a reminder. I verified with the pt that the address we have on file is correct. Pt verbalized understanding of results. Pt had no questions at this time but was encouraged to call back if questions arise. I have sent the order to [facility] and have received confirmation that they have received the order.

## 2018-10-17 ENCOUNTER — Encounter (HOSPITAL_COMMUNITY): Payer: Self-pay | Admitting: Internal Medicine

## 2018-10-19 ENCOUNTER — Ambulatory Visit (HOSPITAL_COMMUNITY)
Admission: RE | Admit: 2018-10-19 | Discharge: 2018-10-19 | Disposition: A | Discharge status: Home / Self Care | Payer: Medicare PPO | Source: Ambulatory Visit | Attending: Internal Medicine | Admitting: Internal Medicine

## 2018-10-19 ENCOUNTER — Other Ambulatory Visit (HOSPITAL_COMMUNITY): Payer: Medicare PPO

## 2018-10-19 DIAGNOSIS — R509 Fever, unspecified: Secondary | ICD-10-CM | POA: Diagnosis not present

## 2018-10-19 DIAGNOSIS — R599 Enlarged lymph nodes, unspecified: Secondary | ICD-10-CM | POA: Diagnosis not present

## 2018-10-19 MED ORDER — IOPAMIDOL (ISOVUE-300) INJECTION 61%
100.0000 mL | Freq: Once | INTRAVENOUS | Status: AC | PRN
  Administered 2018-10-19: 100 mL via INTRAVENOUS

## 2018-10-22 ENCOUNTER — Inpatient Hospital Stay (HOSPITAL_COMMUNITY): Payer: Medicare PPO | Attending: Internal Medicine | Admitting: Internal Medicine

## 2018-10-22 ENCOUNTER — Encounter (HOSPITAL_COMMUNITY): Payer: Self-pay | Admitting: Internal Medicine

## 2018-10-22 ENCOUNTER — Other Ambulatory Visit: Payer: Self-pay

## 2018-10-22 VITALS — BP 132/78 | HR 68 | Temp 98.2°F | Resp 18 | Wt 205.0 lb

## 2018-10-22 DIAGNOSIS — Z79899 Other long term (current) drug therapy: Secondary | ICD-10-CM | POA: Diagnosis not present

## 2018-10-22 DIAGNOSIS — R918 Other nonspecific abnormal finding of lung field: Secondary | ICD-10-CM | POA: Diagnosis not present

## 2018-10-22 DIAGNOSIS — E119 Type 2 diabetes mellitus without complications: Secondary | ICD-10-CM | POA: Diagnosis not present

## 2018-10-22 DIAGNOSIS — G4730 Sleep apnea, unspecified: Secondary | ICD-10-CM | POA: Diagnosis not present

## 2018-10-22 DIAGNOSIS — I10 Essential (primary) hypertension: Secondary | ICD-10-CM | POA: Diagnosis not present

## 2018-10-22 DIAGNOSIS — C792 Secondary malignant neoplasm of skin: Secondary | ICD-10-CM | POA: Diagnosis not present

## 2018-10-22 DIAGNOSIS — C4359 Malignant melanoma of other part of trunk: Secondary | ICD-10-CM

## 2018-10-22 DIAGNOSIS — C9110 Chronic lymphocytic leukemia of B-cell type not having achieved remission: Secondary | ICD-10-CM | POA: Diagnosis not present

## 2018-10-22 DIAGNOSIS — Z794 Long term (current) use of insulin: Secondary | ICD-10-CM | POA: Diagnosis not present

## 2018-10-22 NOTE — Telephone Encounter (Signed)
Order sent to [facility] via community message in [place]. Received a receipt of confirmation.

## 2018-10-22 NOTE — Telephone Encounter (Signed)
Pt called stating that Mark Huang is out of network. Requesting CPAP supplies go through [facility].

## 2018-10-22 NOTE — Progress Notes (Signed)
Diagnosis No diagnosis found.  Staging Cancer Staging No matching staging information was found for the patient.  Assessment and Plan:  1. CLL stage 32-56.    67 year old male referred for evaluation due to leucocytosis.  He denies smoking.  He reports his mother was told in the past she had an elevated WBC and had work-up for leukemia that was reportedly negative.  He denies any Prednisone usage.  He reports he had neurosurgery 3 years ago for a reported bleed.  Since that time he has developed skin lesions on scalp and face and also has one on his chest wall.  He denies taking NSAIDS or ASA currently.  He reports he has been told he has sleep apnea.  He denies any fevers, chills, night sweats and has noted no adenopathy.  Review of chart shows pt had labs done with Dr. Nevada Crane on 02/13/2018 that showed WBC 12.3 HB 13.9 plts 211,000.  He had 56% lymphocytes.  Chemistries WNL with K+ 4.5 Cr 1 and normal LFTs.    Labs done 10/01/2018 reviewed and showed WBC 18.7 HB 13.9 plts 243,000.  Review of smear shows elevated lymphocyte count of 71% with smudge cells.  Chemistries WNL with K+ 3.8, Cr 1.02 and normal LFTs.  LDH WNL at 134.   Flow cytometry returned + for CLL.  Cytogenetics WNL.   Negative BCR/ABL and Jak 2.   Pt has CT CAP done 10/19/2018 that showed  IMPRESSION: There multiple prominent and mildly enlarged lymph nodes within the mediastinum, retroperitoneum and upper abdomen.  Multiple pulmonary nodules as described above measuring up to 8 mm. Non-contrast chest CT at 3-6 months is recommended. If the nodules are stable at time of repeat CT, then future CT at 18-24 months (from today's scan) is considered optional for low-risk patients, but is recommended for high-risk patients  No splenomegaly noted on scan.    Long talk held with pt and he was provided written information regarding diagnosis. Natural history of CLL discussed.   I discussed with him flow cytometry is consistent with CLL.  He  has mildly elevated WBC of 18.7 HB 13.9 plts 243,000.  He denies B symptoms.  I have discussed with him he will remain on observation as he has early stage CLL.  Findings on scan maybe more concerning relating to melanoma diagnosis.    2  Melanoma.  Pt has chest wall, scalp and tops of hand lesions.  He reports the chest wall lesion has been present for 2 years.  Pt reportedly had a biopsy done by Dr. Nevada Crane that was reportedly consistent with melanoma.  Will obtain path for review.  Dr. Nevada Crane has referred pt to [facility] for surgical evaluation.  I will discuss case with that physician especially in light of recent scan results.  I have discussed with pt usual evaluation with PET scan recommended and SLN evaluation.    3.  Pulmonary nodules. Largest 8 MM.  In light of reported Melanoma diagnosis, pt will be recommended for PET scan pending my discussion with [place].    4.  Facial erythema.  Pt reports this occurred after he was admitted by Dr. Saintclair Halsted on 12/30/2015 and discharged on 12/31/2017.  At that time pt had a severe headache and was found to have SDH and underwent burr hole craniotomy.  He reports since that time he has developed facial erythema and scalp lesions.  Pt has normal EPO, SPEP, iron studies.  Likely due to reportedly history of sunburns  and melanoma.  5.  Subdural Hematoma (SDH).  Pt was admitted by Dr. Saintclair Halsted on 12/30/2015 and discharged on 12/31/2017 for SDH and underwent burr hole craniotomy.  He denies any Headache or blurred vision currently.  Pt will need MRI of brain due to reported melanoma diagnosis.  Due to SDH he would not be a candidate for Ibrutinib if therapy ever indicated for CLL due to bleeding risk associated with medication.   6  HTN.  BP 132/78.  Follow-up with PCP.   7.  DM.  Follow-up with PCP.    8.  Sleep apnea.  Follow-up with Sleep medicine as directed.    Greater than 25 minutes spent with more than 50% spent in review of records, counseling and coordination of  care.    Historical data obtained from note dated 10/01/2018.  67 year old male referred for evaluation due to leucocytosis.  He denies smoking.  He reports his mother was told in the past she had an elevated WBC and had workup for leukemia that was reportedly negative.  He denies any Prednisone usage.  He reports he had neurosurgery 3 years ago for a reported bleed.  Since that time he has developed skin lesions on scalp and face and also has one on his chest wall.  He denies taking NSAIDS or ASA currently.  He reports he has been told he has sleep apnea.  He denies any fevers, chills, night sweats and has noted no adenopathy.  Review of chart shows pt had labs done 02/13/2018 that showed WBC 12.3 HB 13.9 plts 211,000.  He had 56% lymphocytes.  Chemistries WNL with K+ 4.5 Cr 1 and normal LFTs.    Current Status:  Pt is seen today for follow-up to go over labs and scans.  He reports he had a biopsy of right chest wall lesion and has been diagnosed with melanoma.     Problem List Patient Active Problem List   Diagnosis Date Noted  . SDH (subdural hematoma) ([place]) [S06.5X9A] 12/30/2015    Past Medical History Past Medical History:  Diagnosis Date  . BPH (benign prostatic hyperplasia)   . COPD (chronic obstructive pulmonary disease) ([place])   . Diabetes mellitus without complication ([place])   . Hypertension   . Seasonal allergies   . Sleep apnea with use of continuous positive airway pressure (CPAP)    uses CPAP at night     Past Surgical History Past Surgical History:  Procedure Laterality Date  . APPENDECTOMY    . CRANIOTOMY Left 12/30/2015   Procedure: BURR HOLE HEMATOMA EVACUATION SUBDURAL;  Surgeon: Kary Kos, MD;  Location: [facility];  Service: Neurosurgery;  Laterality: Left;  . TONSILLECTOMY      Family History History reviewed. No pertinent family history.   Social History  reports that he has never smoked. He has never used smokeless tobacco. He reports that he does not drink  alcohol or use drugs.  Medications  Current Outpatient Medications:  .  atorvastatin (LIPITOR) 20 MG tablet, Take 20 mg by mouth daily., Disp: , Rfl:  .  cetirizine (ZYRTEC) 10 MG tablet, Take 10 mg by mouth daily as needed for allergies. , Disp: , Rfl:  .  Dapagliflozin-metFORMIN HCl ER (XIGDUO XR) 03-999 MG TB24, Take by mouth., Disp: , Rfl:  .  Empagliflozin-metFORMIN HCl ER (SYNJARDY XR) 12.03-999 MG TB24, Take by mouth., Disp: , Rfl:  .  fluticasone (FLONASE) 50 MCG/ACT nasal spray, Place 1 spray into both nostrils daily., Disp: ,  Rfl:  .  insulin glargine (LANTUS) 100 UNIT/ML injection, Inject 25-35 Units into the skin daily. , Disp: , Rfl:  .  irbesartan-hydrochlorothiazide (AVALIDE) 300-12.5 MG tablet, Take 1 tablet by mouth daily., Disp: , Rfl:  .  meloxicam (MOBIC) 15 MG tablet, , Disp: , Rfl: 1 .  tamsulosin (FLOMAX) 0.4 MG CAPS capsule, Take 0.4 mg by mouth at bedtime. , Disp: , Rfl:   Allergies Penicillins and Sulfa antibiotics  Review of Systems Review of Systems - Oncology ROS negative  Physical Exam  Vitals Wt Readings from Last 3 Encounters:  10/22/18 205 lb (93 kg)  10/01/18 202 lb 12.8 oz (92 kg)  06/25/18 197 lb (89.4 kg)   Temp Readings from Last 3 Encounters:  10/22/18 98.2 F (36.8 C) (Oral)  10/01/18 98.7 F (37.1 C) (Oral)  01/01/16 99.1 F (37.3 C) (Oral)   BP Readings from Last 3 Encounters:  10/22/18 132/78  10/01/18 (!) 146/71  06/25/18 130/80   Pulse Readings from Last 3 Encounters:  10/22/18 68  10/01/18 64  06/25/18 64   Constitutional: Well-developed, well-nourished, and in no distress.   HENT: Head: Normocephalic and atraumatic.  Mouth/Throat: No oropharyngeal exudate. Mucosa moist. Eyes: Pupils are equal, round, and reactive to light. Conjunctivae are normal. No scleral icterus.  Neck: Normal range of motion. Neck supple. No JVD present.  Cardiovascular: Normal rate, regular rhythm and normal heart sounds.  Exam reveals no gallop  and no friction rub.   No murmur heard. Pulmonary/Chest: Effort normal and breath sounds normal. No respiratory distress. No wheezes.No rales.  Abdominal: Soft. Bowel sounds are normal. No distension. There is no tenderness. There is no guarding.  Musculoskeletal: No edema or tenderness.  Lymphadenopathy: No cervical, axillary or supraclavicular adenopathy.  Neurological: Alert and oriented to person, place, and time. No cranial nerve deficit.  Skin: pt has large right chest wall lesion.  He has scalp lesion.  He has lesions noted on tops of hands.  He has chronic solar changes noted and moles.   Psychiatric: Affect and judgment normal.   Labs No visits with results within 3 Day(s) from this visit.  Latest known visit with results is:  Lab on 10/01/2018  Component Date Value Ref Range Status  . WBC 10/01/2018 18.7* 4.0 - 10.5 K/uL Final  . RBC 10/01/2018 5.03  4.22 - 5.81 MIL/uL Final  . Hemoglobin 10/01/2018 13.9  13.0 - 17.0 g/dL Final  . HCT 10/01/2018 44.3  39.0 - 52.0 % Final  . MCV 10/01/2018 88.1  80.0 - 100.0 fL Final  . MCH 10/01/2018 27.6  26.0 - 34.0 pg Final  . MCHC 10/01/2018 31.4  30.0 - 36.0 g/dL Final  . RDW 10/01/2018 13.6  11.5 - 15.5 % Final  . Platelets 10/01/2018 243  150 - 400 K/uL Final  . nRBC 10/01/2018 0.0  0.0 - 0.2 % Final  . Neutrophils Relative % 10/01/2018 24  % Final  . Neutro Abs 10/01/2018 4.6  1.7 - 7.7 K/uL Final  . Lymphocytes Relative 10/01/2018 71  % Final  . Lymphs Abs 10/01/2018 13.1* 0.7 - 4.0 K/uL Final  . Monocytes Relative 10/01/2018 4  % Final  . Monocytes Absolute 10/01/2018 0.7  0.1 - 1.0 K/uL Final  . Eosinophils Relative 10/01/2018 1  % Final  . Eosinophils Absolute 10/01/2018 0.2  0.0 - 0.5 K/uL Final  . Basophils Relative 10/01/2018 0  % Final  . Basophils Absolute 10/01/2018 0.1  0.0 - 0.1 K/uL Final  .  WBC Morphology 10/01/2018 ABSOLUTE LYMPHOCYTOSIS   Final  . Immature Granulocytes 10/01/2018 0  % Final  . Abs Immature  Granulocytes 10/01/2018 0.05  0.00 - 0.07 K/uL Final  . Reactive, Benign Lymphocytes 10/01/2018 PRESENT   Final  . Smudge Cells 10/01/2018 PRESENT   Final   Performed at [facility], [address], [place], [place] [address]  . Sodium 10/01/2018 141  135 - 145 mmol/L Final  . Potassium 10/01/2018 3.8  3.5 - 5.1 mmol/L Final  . Chloride 10/01/2018 107  98 - 111 mmol/L Final  . CO2 10/01/2018 26  22 - 32 mmol/L Final  . Glucose, Bld 10/01/2018 104* 70 - 99 mg/dL Final  . BUN 10/01/2018 14  8 - 23 mg/dL Final  . Creatinine, Ser 10/01/2018 1.02  0.61 - 1.24 mg/dL Final  . Calcium 10/01/2018 9.5  8.9 - 10.3 mg/dL Final  . Total Protein 10/01/2018 8.1  6.5 - 8.1 g/dL Final  . Albumin 10/01/2018 4.4  3.5 - 5.0 g/dL Final  . AST 10/01/2018 20  15 - 41 U/L Final  . ALT 10/01/2018 17  0 - 44 U/L Final  . Alkaline Phosphatase 10/01/2018 71  38 - 126 U/L Final  . Total Bilirubin 10/01/2018 0.6  0.3 - 1.2 mg/dL Final  . GFR calc non Af Amer 10/01/2018 >60  >60 mL/min Final  . GFR calc Af Amer 10/01/2018 >60  >60 mL/min Final   Comment: (NOTE) The eGFR has been calculated using the CKD EPI equation. This calculation has not been validated in all clinical situations. eGFR's persistently <60 mL/min signify possible Chronic Kidney Disease.   Georgiann Hahn gap 10/01/2018 8  5 - 15 Final   Performed at [facility], [address], [place], [place] [address]  . LDH 10/01/2018 134  98 - 192 U/L Final   Performed at [facility], [address] [place], [place] [address]  . Ferritin 10/01/2018 30  24 - 336 ng/mL Final   Performed at [facility], [address], [place], [place] [address]  . Iron 10/01/2018 55  45 - 182 ug/dL Final  . TIBC 10/01/2018 381  250 - 450 ug/dL Final  . Saturation Ratios 10/01/2018 14* 17.9 - 39.5 % Final  . UIBC 10/01/2018 326  ug/dL Final   Performed at [facility], [address] [place], [place] [address]  . DNA Mutation Analysis 10/01/2018 Comment   Final   Comment:  (NOTE) NO MUTATION IDENTIFIED Interpretation: This patient's sample was analyzed for the hereditary hemochromatosis (HH) mutations C282Y, H63D, S65C. No mutation was identified. The mutations analyzed by [facility] are most common in the Caucasian population, and up to 90% of affected Caucasians will have a positive test result. Because this panel does not identify rare HH mutations or HH mutations found in other ethnic groups, there are a small number of people who may have a negative test but may actually be affected. The diagnosis of HH should include clinical findings and other test results such as transferrin-iron saturation and/or serum ferritin studies and/or liver biopsy.  If this patient has a history of HH, in many cases a specific carrier risk can be determined based on this negative result. Methodology: DNA Analysis of the HFE gene was performed by PCR amplification followed by restriction enzyme digestion analyses. Reference: Cathe Mons and Walker AP. (20  00). Genet Test 4:97-101. Cogswell ME et al. (1999). AM J Prev Med 16:134-140. Bomford A (2002). Lancet 360(9346):1673-81. Stan Head et al. (2002). Blood Cells, Molecules. and Diseases.  29(3):418-432. Imperatore G et al. (2003). Genet Med. 5(1):1-8. Cogswell ME et al. (2003). Genet Med. 5(4):304-10. This test was developed and its performance characteristics determined by [facility]. It has not been cleared or approved by the Food and Drug Administration. Genetic counselors are available for health care providers to discuss results at [phone]. Allison Quarry, PhD, [facility] Ruben Reason, PhD, [facility] Annetta Maw, M.S., PhD, [facility] Alfredo Bach, PhD, [facility] Norva Riffle, PhD, [facility] Earlean Polka, PhD, [facility] Performed At: [facility] [address] [place], [place] [id number] Nechama Guard MD [id number]   . Erythropoietin 10/01/2018 15.5  2.6 - 18.5 mIU/mL Final    Comment: (NOTE) Beckman Coulter UniCel DxI [place] obtained with different assay methods or kits cannot be used interchangeably. Results cannot be interpreted as absolute evidence of the presence or absence of malignant disease. Performed At: [facility] [place], [place] [id number] Rush Farmer MD [id number]   . b2a2 transcript 10/01/2018 Comment  % Final   Comment: (NOTE)           <0.0032 % (sensitivity limit of assay)   . b3a2 transcript 10/01/2018 Comment  % Final   Comment: (NOTE)           <0.0032 % (sensitivity limit of assay)   . E1A2 Transcript 10/01/2018 Comment  % Final   Comment: (NOTE)           <0.0032 % (sensitivity limit of assay)   . Interpretation (BCRAL): 10/01/2018 Comment   Final   Comment: (NOTE) NEGATIVE for the BCR-ABL1 e1a2 (p190), e13a2 (b2a2, p210) and e14a2 (b3a2, p210) fusion transcripts. These results do not rule out the presence of rare BCR-ABL1 transcripts not detected by this assay.   . Director Review [facility]): 10/01/2018 Comment   Final   Comment: (NOTE) Constance Goltz, PhD, [facility]               Director, [place] for [place] and [place], [place]   . Background: 10/01/2018 Comment   Corrected   Comment: (NOTE) This assay can detect three different types of BCR-ABL1 fusion transcripts associated with CML, ALL, and AML: e13a2 (previously b2a2) and e14a2 (previously b3a2) (major breakpoint, p210), as well as e1a2 (minor breakpoint, p190). The e13a2 and e14a2 transcript values are titrated to the current International Scale (IS). The standardized baseline is 100% BCR-ABL1 (IS) and major molecular response (MMR) is equivalent to 0.1% BCR-ABL1 (IS) corresponding to a 3-log reduction. Results should be correlated with appropriate clinical and laboratory information as  indicated.   . Methodology 10/01/2018 Comment   Corrected   Comment: (NOTE) Total RNA is isolated from the sample and subject to a real-time, reverse transcriptase polymerase chain reaction (RT-PCR). The PCR primers and probes are specific for BCR-ABL1 e13a2, e14a2 and e1a2 fusion transcripts. The ABL1 transcript is amplified as the control for cDNA quantity and quality. Serial dilutions of a validated positive control RNA with known  t(9;22) BCR-ABL1 are used as reference for quantification of BCR-ABL1 relative to ABL1. The numeric BCR-ABL1 level is reportd as % BCR-ABL1/ABL1 and the detection sensitivity is 4.5 log below the standard baseline. This test was developed and its performance characteristics determined by [facility]. It has not been cleared or approved by the Food and Drug Administration. References:    1. Anastasia Fiedler and Branford S: Seminars in Hematology 2003;       40 (suppl2):62-68.    2. White HE, et al. Blood 2010; 116: e111-117.    3. NCCN Clinical Practice Guidelines in Oncology, Chronic       Myeloid Leukemia. V2. 2017.                           Performed At: [facility] [address] [place], [place] [id number] Nechama Guard MD [id number] Performed At: [facility] [place], [place] [id number] Nechama Guard MD DX:[id number]   . JAK2 GenotypR 10/01/2018 Comment   Final   Comment: (NOTE) Result: NEGATIVE for the JAK2 V617F mutation. Interpretation:  The G to T nucleotide change encoding the V617F mutation was not detected.  This result does not rule out the presence of the JAK2 mutation at a level below the sensitivity of detection of this assay, or the presence of other mutations within JAK2 not detected by this assay.  This result does not rule out a diagnosis of polycythemia vera, essential thrombocythemia or idiopathic myelofibrosis as the V617F mutation is not detected in all patients with these disorders.   .  Director Review, JAK2 10/01/2018 Comment   Corrected   Comment: (NOTE) Constance Goltz, PhD, [facility]               Director, [place] for [place], [place]               [phone] This test was developed and its performance characteristics determined by [facility]. It has not been cleared or approved by the Food and Drug Administration. Performed At: [facility] [address] [place], [place] [id number] Nechama Guard MD [id number] Performed At: [facility] [place], [place] [id number] Nechama Guard MD [id number]   . BACKGROUND: 10/01/2018 Comment   Corrected   Comment: (NOTE) JAK2 is a cytoplasmic tyrosine kinase with a key role in signal transduction from multiple hematopoietic growth factor receptors. A point mutation within exon 14 of the JAK2 gene ([id number]) encoding a valine to phenylalanine substitution at position 617 of the JAK2 protein (V617F) has been identified in most patients with polycythemia vera, and in about half of those with either essential thrombocythemia or idiopathic myelofibrosis. The V617F has also been detected, although infrequently, in other myeloid disorders such as chronic myelomonocytic leukemia and chronic neutrophilic luekemia. V617F is an acquired mutation that alters a highly conserved valine present in the negative regulatory JH2 domain of the JAK2 protein and is predicted to dysregulate kinase activity. Methodology: Total genomic DNA was extracted and subjected to TaqMan real-time PCR amplification/detection. Two amplification products per sample were monitored by real-time PCR using primers/probes s  pecific to JAK2 wild type (WT) and JAK2 mutant V617F. The ABI7900 Absolute Quantitation software will compare the patient specimen valuse to the standard curves and generate  percent values for wild type and mutant type. In vitro studies have indicated that this assay has an analytical sensitivity of 1%. References: Baxter EJ, Scott Phineas Real, et al. Acquired mutation of the tyrosine kinase JAK2 in human myeloproliferative disorders. Lancet. 2005 Mar 19-25; 365(9464):1054-1061. Alfonso Ramus Couedic JP. A unique clonal JAK2 mutation leading to constitutive signaling causes polycythaemia vera. Nature. 2005 Apr 28; 434(7037):1144-1148. Kralovics R, Passamonti F, Buser AS, et al. A gain-of-function mutation of JAK2 in myeloproliferative disorders. N Engl J Med. 2005 Apr 28; 352(17):1779-1790.   . Sed Rate 10/01/2018 7  0 - 16 mm/hr Final   Performed at [facility], [address], [place], [place] [address]  . CRP 10/01/2018 0.9  <1.0 mg/dL Final   Performed at [facility] [address] [place], [place] [address]  . Total Protein ELP 10/01/2018 7.2  6.0 - 8.5 g/dL Final  . Albumin ELP 10/01/2018 4.1  2.9 - 4.4 g/dL Final  . Alpha-1-Globulin 10/01/2018 0.2  0.0 - 0.4 g/dL Final  . Alpha-2-Globulin 10/01/2018 0.9  0.4 - 1.0 g/dL Final  . Beta Globulin 10/01/2018 1.1  0.7 - 1.3 g/dL Final  . Gamma Globulin 10/01/2018 1.0  0.4 - 1.8 g/dL Final  . M-Spike, % 10/01/2018 Not Observed  Not Observed g/dL Final  . SPE Interp. 10/01/2018 Comment   Final   Comment: (NOTE) The SPE pattern appears essentially unremarkable. Evidence of monoclonal protein is not apparent. Performed At: [facility] [place], [place] [id number] Rush Farmer MD [id number]   . Comment 10/01/2018 Comment   Final   Comment: (NOTE) Protein electrophoresis scan will follow via computer, mail, or courier delivery.   Marland Kitchen GLOBULIN, TOTAL 10/01/2018 3.1  2.2 - 3.9 g/dL Corrected  . A/G Ratio 10/01/2018 1.3  0.7 - 1.7 Corrected     Pathology No orders of the defined types were placed in this encounter.      Zoila Shutter MD

## 2018-10-24 DIAGNOSIS — C4359 Malignant melanoma of other part of trunk: Secondary | ICD-10-CM | POA: Diagnosis not present

## 2018-10-26 DIAGNOSIS — D485 Neoplasm of uncertain behavior of skin: Secondary | ICD-10-CM | POA: Diagnosis not present

## 2018-10-29 DIAGNOSIS — E1165 Type 2 diabetes mellitus with hyperglycemia: Secondary | ICD-10-CM | POA: Diagnosis not present

## 2018-10-29 DIAGNOSIS — E119 Type 2 diabetes mellitus without complications: Secondary | ICD-10-CM | POA: Diagnosis not present

## 2018-10-29 DIAGNOSIS — E782 Mixed hyperlipidemia: Secondary | ICD-10-CM | POA: Diagnosis not present

## 2018-10-29 DIAGNOSIS — I10 Essential (primary) hypertension: Secondary | ICD-10-CM | POA: Diagnosis not present

## 2018-11-02 ENCOUNTER — Encounter: Payer: Self-pay | Admitting: Hematology

## 2018-11-23 DIAGNOSIS — C439 Malignant melanoma of skin, unspecified: Secondary | ICD-10-CM | POA: Diagnosis not present

## 2018-11-23 DIAGNOSIS — E119 Type 2 diabetes mellitus without complications: Secondary | ICD-10-CM | POA: Diagnosis not present

## 2018-11-23 DIAGNOSIS — C4361 Malignant melanoma of right upper limb, including shoulder: Secondary | ICD-10-CM | POA: Diagnosis not present

## 2018-11-23 DIAGNOSIS — C4359 Malignant melanoma of other part of trunk: Secondary | ICD-10-CM | POA: Diagnosis not present

## 2018-11-23 DIAGNOSIS — C773 Secondary and unspecified malignant neoplasm of axilla and upper limb lymph nodes: Secondary | ICD-10-CM | POA: Diagnosis not present

## 2018-11-23 DIAGNOSIS — L905 Scar conditions and fibrosis of skin: Secondary | ICD-10-CM | POA: Diagnosis not present

## 2018-11-23 DIAGNOSIS — C4362 Malignant melanoma of left upper limb, including shoulder: Secondary | ICD-10-CM | POA: Diagnosis not present

## 2018-11-23 DIAGNOSIS — I10 Essential (primary) hypertension: Secondary | ICD-10-CM | POA: Diagnosis not present

## 2018-11-26 DIAGNOSIS — G4733 Obstructive sleep apnea (adult) (pediatric): Secondary | ICD-10-CM | POA: Diagnosis not present

## 2018-11-27 ENCOUNTER — Other Ambulatory Visit (HOSPITAL_COMMUNITY): Payer: Self-pay | Admitting: Internal Medicine

## 2018-11-27 DIAGNOSIS — E782 Mixed hyperlipidemia: Secondary | ICD-10-CM | POA: Diagnosis not present

## 2018-11-27 DIAGNOSIS — E1165 Type 2 diabetes mellitus with hyperglycemia: Secondary | ICD-10-CM | POA: Diagnosis not present

## 2018-11-27 DIAGNOSIS — C9110 Chronic lymphocytic leukemia of B-cell type not having achieved remission: Secondary | ICD-10-CM

## 2018-11-27 DIAGNOSIS — E119 Type 2 diabetes mellitus without complications: Secondary | ICD-10-CM | POA: Diagnosis not present

## 2018-11-27 DIAGNOSIS — I10 Essential (primary) hypertension: Secondary | ICD-10-CM | POA: Diagnosis not present

## 2018-11-28 ENCOUNTER — Inpatient Hospital Stay (HOSPITAL_COMMUNITY): Payer: Medicare PPO | Attending: Internal Medicine | Admitting: Internal Medicine

## 2018-11-28 ENCOUNTER — Inpatient Hospital Stay (HOSPITAL_COMMUNITY): Payer: Medicare PPO

## 2018-11-28 ENCOUNTER — Encounter (HOSPITAL_COMMUNITY): Payer: Self-pay | Admitting: Internal Medicine

## 2018-11-28 ENCOUNTER — Other Ambulatory Visit: Payer: Self-pay

## 2018-11-28 VITALS — BP 143/65 | HR 65 | Temp 98.4°F | Resp 18 | Wt 203.9 lb

## 2018-11-28 DIAGNOSIS — I10 Essential (primary) hypertension: Secondary | ICD-10-CM | POA: Insufficient documentation

## 2018-11-28 DIAGNOSIS — C9110 Chronic lymphocytic leukemia of B-cell type not having achieved remission: Secondary | ICD-10-CM | POA: Diagnosis not present

## 2018-11-28 DIAGNOSIS — Z794 Long term (current) use of insulin: Secondary | ICD-10-CM | POA: Insufficient documentation

## 2018-11-28 DIAGNOSIS — E119 Type 2 diabetes mellitus without complications: Secondary | ICD-10-CM | POA: Insufficient documentation

## 2018-11-28 DIAGNOSIS — G4730 Sleep apnea, unspecified: Secondary | ICD-10-CM

## 2018-11-28 DIAGNOSIS — C4359 Malignant melanoma of other part of trunk: Secondary | ICD-10-CM | POA: Diagnosis not present

## 2018-11-28 DIAGNOSIS — C439 Malignant melanoma of skin, unspecified: Secondary | ICD-10-CM

## 2018-11-28 DIAGNOSIS — R599 Enlarged lymph nodes, unspecified: Secondary | ICD-10-CM | POA: Diagnosis not present

## 2018-11-28 DIAGNOSIS — C792 Secondary malignant neoplasm of skin: Secondary | ICD-10-CM | POA: Insufficient documentation

## 2018-11-28 DIAGNOSIS — Z79899 Other long term (current) drug therapy: Secondary | ICD-10-CM | POA: Insufficient documentation

## 2018-11-28 LAB — CBC WITH DIFFERENTIAL/PLATELET
ABS IMMATURE GRANULOCYTES: 0.05 10*3/uL (ref 0.00–0.07)
Basophils Absolute: 0.1 10*3/uL (ref 0.0–0.1)
Basophils Relative: 0 %
Eosinophils Absolute: 0.2 10*3/uL (ref 0.0–0.5)
Eosinophils Relative: 1 %
HCT: 45.6 % (ref 39.0–52.0)
Hemoglobin: 14.2 g/dL (ref 13.0–17.0)
Immature Granulocytes: 0 %
LYMPHS PCT: 70 %
Lymphs Abs: 13.3 10*3/uL — ABNORMAL HIGH (ref 0.7–4.0)
MCH: 27.7 pg (ref 26.0–34.0)
MCHC: 31.1 g/dL (ref 30.0–36.0)
MCV: 89.1 fL (ref 80.0–100.0)
Monocytes Absolute: 0.7 10*3/uL (ref 0.1–1.0)
Monocytes Relative: 4 %
Neutro Abs: 4.7 10*3/uL (ref 1.7–7.7)
Neutrophils Relative %: 25 %
Platelets: 239 10*3/uL (ref 150–400)
RBC: 5.12 MIL/uL (ref 4.22–5.81)
RDW: 13.5 % (ref 11.5–15.5)
WBC: 19 10*3/uL — ABNORMAL HIGH (ref 4.0–10.5)
nRBC: 0 % (ref 0.0–0.2)

## 2018-11-28 LAB — COMPREHENSIVE METABOLIC PANEL
ALT: 16 U/L (ref 0–44)
AST: 19 U/L (ref 15–41)
Albumin: 4.4 g/dL (ref 3.5–5.0)
Alkaline Phosphatase: 80 U/L (ref 38–126)
Anion gap: 12 (ref 5–15)
BUN: 12 mg/dL (ref 8–23)
CO2: 25 mmol/L (ref 22–32)
Calcium: 9.5 mg/dL (ref 8.9–10.3)
Chloride: 102 mmol/L (ref 98–111)
Creatinine, Ser: 1.01 mg/dL (ref 0.61–1.24)
GFR calc Af Amer: >60 mL/min (ref >60)
GFR calc non Af Amer: >60 mL/min (ref >60)
Glucose, Bld: 150 mg/dL — ABNORMAL HIGH (ref 70–99)
Potassium: 3.4 mmol/L — ABNORMAL LOW (ref 3.5–5.1)
Sodium: 139 mmol/L (ref 135–145)
Total Bilirubin: 0.9 mg/dL (ref 0.3–1.2)
Total Protein: 7.8 g/dL (ref 6.5–8.1)

## 2018-11-28 LAB — LACTATE DEHYDROGENASE: LDH: 152 U/L (ref 98–192)

## 2018-11-28 NOTE — Progress Notes (Signed)
Diagnosis No diagnosis found.  Staging Cancer Staging No matching staging information was found for the patient.  Assessment and Plan:   1. CLL stage 54-47.    68 year old male referred for evaluation due to leucocytosis.  He denies smoking.  He reports his mother was told in the past she had an elevated WBC and had work-up for leukemia that was reportedly negative.  He denies any Prednisone usage.  He reports he had neurosurgery 3 years ago for a reported bleed.  Since that time he has developed skin lesions on scalp and face and also has one on his chest wall.  He denies taking NSAIDS or ASA currently.  He reports he has been told he has sleep apnea.  He denies any fevers, chills, night sweats and has noted no adenopathy.  Review of chart shows pt had labs done with Dr. Nevada Crane on 02/13/2018 that showed WBC 12.3 HB 13.9 plts 211,000.  He had 56% lymphocytes.  Chemistries WNL with K+ 4.5 Cr 1 and normal LFTs.    Labs done 10/01/2018 Flow cytometry returned + for CLL.  Cytogenetics WNL.   Negative BCR/ABL and Jak 2.   Pt has CT CAP done 10/19/2018 that showed  IMPRESSION: There multiple prominent and mildly enlarged lymph nodes within the mediastinum, retroperitoneum and upper abdomen.  Multiple pulmonary nodules as described above measuring up to 8 mm. Non-contrast chest CT at 3-6 months is recommended. If the nodules are stable at time of repeat CT, then future CT at 18-24 months (from today's scan) is considered optional for low-risk patients, but is recommended for high-risk patients  No splenomegaly noted on scan.    Labs done today 11/28/2018 reviewed and showed WBC stable at 19 HB 14.2 plts 239,000.  Chemistries WNL with K+ 3.4 Cr 1 normal LFTs.  Pt will remain on observation due to stable findings and early stage CLL.    Findings on scan more concerning relating to melanoma diagnosis.  He will be recommended for PET scan once drains removed.    2  Melanoma.  Pt has chest wall, scalp and  tops of hand lesions.  He reports the chest wall lesion has been present for 2 years.  Pt had a biopsy done by Dr. Nevada Crane on 10/15/2018 that showed Melanoma, superficial spreading.  Breslow Depth 1.2 mm Mitotic index 2  Dr. Nevada Crane referred pt to [facility] for surgical evaluation. Review of chart shows pt had wide excision and SLN mapping done 11/23/2018.  Path is pending.  Pt has drains in place.  He is scheduled to be seen at [facility] on 12/04/2018.  I discussed with him we will continue to monitor for pathology but may take 1-2 weeks for final results.  He will need to follow-up with [facility] for surgical evaluation and management of drains.  I discussed with him once path is reviewed he will have detailed discussion of treatment options that may involve immunotherapy.  In light of the scan results he will be recommended for PET imaging once drains removed.    3.  Pulmonary nodules. Largest 8 MM.  In light of reported Melanoma diagnosis, pt will be recommended for PET scan once drains removed.    4.  Facial erythema.  Pt reports this occurred after he was admitted by Dr. Saintclair Halsted on 12/30/2015 and discharged on 12/31/2017.  At that time pt had a severe headache and was found to have SDH and underwent burr hole craniotomy.  He reports since that time he  has developed facial erythema and scalp lesions.  Pt has normal EPO, SPEP, iron studies.  Likely due to history of sunburns and melanoma.  5.  Subdural Hematoma (SDH).  Pt was admitted by Dr. Saintclair Halsted on 12/30/2015 and discharged on 12/31/2017 for SDH and underwent burr hole craniotomy.  He denies any Headache or blurred vision currently.  Pt will need MRI of brain due to melanoma diagnosis.  Due to SDH he would not be a candidate for Ibrutinib if therapy ever indicated for CLL due to bleeding risk associated with medication.   6  HTN.  BP 143/65.   Follow-up with PCP.   7.  DM.  Follow-up with PCP.    8.  Sleep apnea.  Follow-up with Sleep medicine as directed.    25 minutes spent  with more than 50% spent in review of records, counseling and coordination of care.    Historical data obtained from note dated 10/01/2018.  68 year old male referred for evaluation due to leucocytosis.  He denies smoking.  He reports his mother was told in the past she had an elevated WBC and had workup for leukemia that was reportedly negative.  He denies any Prednisone usage.  He reports he had neurosurgery 3 years ago for a reported bleed.  Since that time he has developed skin lesions on scalp and face and also has one on his chest wall.  He denies taking NSAIDS or ASA currently.  He reports he has been told he has sleep apnea.  He denies any fevers, chills, night sweats and has noted no adenopathy.  Review of chart shows pt had labs done 02/13/2018 that showed WBC 12.3 HB 13.9 plts 211,000.  He had 56% lymphocytes.  Chemistries WNL with K+ 4.5 Cr 1 and normal LFTs.    Current Status:  Pt is seen today for follow-up to go over labs.  He had recent surgery at [facility] and has follow-up there on 12/04/2018.  He has drain in place.     Problem List Patient Active Problem List   Diagnosis Date Noted  . SDH (subdural hematoma) ([place]) [S06.5X9A] 12/30/2015    Past Medical History Past Medical History:  Diagnosis Date  . BPH (benign prostatic hyperplasia)   . COPD (chronic obstructive pulmonary disease) ([place])   . Diabetes mellitus without complication ([place])   . Hypertension   . Seasonal allergies   . Sleep apnea with use of continuous positive airway pressure (CPAP)    uses CPAP at night     Past Surgical History Past Surgical History:  Procedure Laterality Date  . APPENDECTOMY    . CRANIOTOMY Left 12/30/2015   Procedure: BURR HOLE HEMATOMA EVACUATION SUBDURAL;  Surgeon: Kary Kos, MD;  Location: [place] [facility];  Service: Neurosurgery;  Laterality: Left;  . TONSILLECTOMY      Family History History reviewed. No pertinent family history.   Social History  reports that he has never smoked. He  has never used smokeless tobacco. He reports that he does not drink alcohol or use drugs.  Medications  Current Outpatient Medications:  .  atorvastatin (LIPITOR) 20 MG tablet, Take 20 mg by mouth daily., Disp: , Rfl:  .  cetirizine (ZYRTEC) 10 MG tablet, Take 10 mg by mouth daily as needed for allergies. , Disp: , Rfl:  .  Empagliflozin-metFORMIN HCl ER (SYNJARDY XR) 12.03-999 MG TB24, Take by mouth., Disp: , Rfl:  .  fluticasone (FLONASE) 50 MCG/ACT nasal spray, Place 1 spray into both nostrils daily.,  Disp: , Rfl:  .  insulin glargine (LANTUS) 100 UNIT/ML injection, Inject 25-35 Units into the skin daily. , Disp: , Rfl:  .  irbesartan-hydrochlorothiazide (AVALIDE) 300-12.5 MG tablet, Take 1 tablet by mouth daily., Disp: , Rfl:  .  tamsulosin (FLOMAX) 0.4 MG CAPS capsule, Take 0.4 mg by mouth at bedtime. , Disp: , Rfl:  .  Dapagliflozin-metFORMIN HCl ER (XIGDUO XR) 03-999 MG TB24, Take by mouth., Disp: , Rfl:  .  meloxicam (MOBIC) 15 MG tablet, , Disp: , Rfl: 1  Allergies Penicillins and Sulfa antibiotics  Review of Systems Review of Systems - Oncology ROS negative   Physical Exam  Vitals Wt Readings from Last 3 Encounters:  11/28/18 203 lb 14.4 oz (92.5 kg)  10/22/18 205 lb (93 kg)  10/01/18 202 lb 12.8 oz (92 kg)   Temp Readings from Last 3 Encounters:  11/28/18 98.4 F (36.9 C) (Oral)  10/22/18 98.2 F (36.8 C) (Oral)  10/01/18 98.7 F (37.1 C) (Oral)   BP Readings from Last 3 Encounters:  11/28/18 (!) 143/65  10/22/18 132/78  10/01/18 (!) 146/71   Pulse Readings from Last 3 Encounters:  11/28/18 65  10/22/18 68  10/01/18 64   Constitutional: Well-developed, well-nourished, and in no distress.   HENT: Head: Normocephalic and atraumatic.  Mouth/Throat: No oropharyngeal exudate. Mucosa moist. Eyes: Pupils are equal, round, and reactive to light. Conjunctivae are normal. No scleral icterus.  Neck: Normal range of motion. Neck supple. No JVD present.   Cardiovascular: Normal rate, regular rhythm and normal heart sounds.  Exam reveals no gallop and no friction rub.   No murmur heard. Pulmonary/Chest: Effort normal and breath sounds normal. No respiratory distress. No wheezes.No rales.  Abdominal: Soft. Bowel sounds are normal. No distension. There is no tenderness. There is no guarding.  Musculoskeletal: No edema or tenderness.  Lymphadenopathy: No cervical, axillary or supraclavicular adenopathy.  Neurological: Alert and oriented to person, place, and time. No cranial nerve deficit.  Skin: Facial erythema.  Incision site on right chest wall.  Drain in place.  Incision site noted right axillary area.   Psychiatric: Affect and judgment normal.   Labs Appointment on 11/28/2018  Component Date Value Ref Range Status  . LDH 11/28/2018 152  98 - 192 U/L Final   Performed at [facility], [address] [place], [place] [address]  . Sodium 11/28/2018 139  135 - 145 mmol/L Final  . Potassium 11/28/2018 3.4* 3.5 - 5.1 mmol/L Final  . Chloride 11/28/2018 102  98 - 111 mmol/L Final  . CO2 11/28/2018 25  22 - 32 mmol/L Final  . Glucose, Bld 11/28/2018 150* 70 - 99 mg/dL Final  . BUN 11/28/2018 12  8 - 23 mg/dL Final  . Creatinine, Ser 11/28/2018 1.01  0.61 - 1.24 mg/dL Final  . Calcium 11/28/2018 9.5  8.9 - 10.3 mg/dL Final  . Total Protein 11/28/2018 7.8  6.5 - 8.1 g/dL Final  . Albumin 11/28/2018 4.4  3.5 - 5.0 g/dL Final  . AST 11/28/2018 19  15 - 41 U/L Final  . ALT 11/28/2018 16  0 - 44 U/L Final  . Alkaline Phosphatase 11/28/2018 80  38 - 126 U/L Final  . Total Bilirubin 11/28/2018 0.9  0.3 - 1.2 mg/dL Final  . GFR calc non Af Amer 11/28/2018 >60  >60 mL/min Final  . GFR calc Af Amer 11/28/2018 >60  >60 mL/min Final  . Anion gap 11/28/2018 12  5 - 15 Final   Performed  at [facility], [address], [place], [place] [address]  . WBC 11/28/2018 19.0* 4.0 - 10.5 K/uL Final  . RBC 11/28/2018 5.12  4.22 - 5.81 MIL/uL Final  . Hemoglobin  11/28/2018 14.2  13.0 - 17.0 g/dL Final  . HCT 11/28/2018 45.6  39.0 - 52.0 % Final  . MCV 11/28/2018 89.1  80.0 - 100.0 fL Final  . MCH 11/28/2018 27.7  26.0 - 34.0 pg Final  . MCHC 11/28/2018 31.1  30.0 - 36.0 g/dL Final  . RDW 11/28/2018 13.5  11.5 - 15.5 % Final  . Platelets 11/28/2018 239  150 - 400 K/uL Final  . nRBC 11/28/2018 0.0  0.0 - 0.2 % Final  . Neutrophils Relative % 11/28/2018 25  % Final  . Neutro Abs 11/28/2018 4.7  1.7 - 7.7 K/uL Final  . Lymphocytes Relative 11/28/2018 70  % Final  . Lymphs Abs 11/28/2018 13.3* 0.7 - 4.0 K/uL Final  . Monocytes Relative 11/28/2018 4  % Final  . Monocytes Absolute 11/28/2018 0.7  0.1 - 1.0 K/uL Final  . Eosinophils Relative 11/28/2018 1  % Final  . Eosinophils Absolute 11/28/2018 0.2  0.0 - 0.5 K/uL Final  . Basophils Relative 11/28/2018 0  % Final  . Basophils Absolute 11/28/2018 0.1  0.0 - 0.1 K/uL Final  . WBC Morphology 11/28/2018 ABSOLUTE LYMPHOCYTOSIS   Final  . Immature Granulocytes 11/28/2018 0  % Final  . Abs Immature Granulocytes 11/28/2018 0.05  0.00 - 0.07 K/uL Final  . Reactive, Benign Lymphocytes 11/28/2018 PRESENT   Final  . Smudge Cells 11/28/2018 PRESENT   Final   Performed at [facility], [address], [place], [place] [address]     Pathology No orders of the defined types were placed in this encounter.      Zoila Shutter MD

## 2018-12-04 DIAGNOSIS — C4359 Malignant melanoma of other part of trunk: Secondary | ICD-10-CM | POA: Diagnosis not present

## 2018-12-10 ENCOUNTER — Other Ambulatory Visit: Payer: Self-pay

## 2018-12-10 ENCOUNTER — Inpatient Hospital Stay (HOSPITAL_COMMUNITY): Payer: Medicare PPO | Attending: Internal Medicine | Admitting: Internal Medicine

## 2018-12-10 VITALS — BP 150/63 | HR 64 | Temp 98.6°F | Resp 16 | Wt 202.0 lb

## 2018-12-10 DIAGNOSIS — C439 Malignant melanoma of skin, unspecified: Secondary | ICD-10-CM

## 2018-12-10 DIAGNOSIS — Z79899 Other long term (current) drug therapy: Secondary | ICD-10-CM | POA: Diagnosis not present

## 2018-12-10 DIAGNOSIS — C4359 Malignant melanoma of other part of trunk: Secondary | ICD-10-CM

## 2018-12-10 DIAGNOSIS — C792 Secondary malignant neoplasm of skin: Secondary | ICD-10-CM | POA: Diagnosis not present

## 2018-12-10 DIAGNOSIS — G4730 Sleep apnea, unspecified: Secondary | ICD-10-CM | POA: Diagnosis not present

## 2018-12-10 DIAGNOSIS — I10 Essential (primary) hypertension: Secondary | ICD-10-CM | POA: Diagnosis not present

## 2018-12-10 DIAGNOSIS — Z794 Long term (current) use of insulin: Secondary | ICD-10-CM | POA: Diagnosis not present

## 2018-12-10 DIAGNOSIS — E119 Type 2 diabetes mellitus without complications: Secondary | ICD-10-CM | POA: Diagnosis not present

## 2018-12-10 DIAGNOSIS — C9110 Chronic lymphocytic leukemia of B-cell type not having achieved remission: Secondary | ICD-10-CM | POA: Diagnosis not present

## 2018-12-10 DIAGNOSIS — N400 Enlarged prostate without lower urinary tract symptoms: Secondary | ICD-10-CM | POA: Diagnosis not present

## 2018-12-10 NOTE — Progress Notes (Signed)
Diagnosis No diagnosis found.  Staging Cancer Staging No matching staging information was found for the patient.  Assessment and Plan:  1. CLL stage 61-59.    68 year old male referred for evaluation due to leucocytosis.  He denies smoking.  He reports his mother was told in the past she had an elevated WBC and had work-up for leukemia that was reportedly negative.  He denies any Prednisone usage.  He reports he had neurosurgery 3 years ago for a reported bleed.  Since that time he has developed skin lesions on scalp and face and also has one on his chest wall.  He denies taking NSAIDS or ASA currently.  He reports he has been told he has sleep apnea.  He denies any fevers, chills, night sweats and has noted no adenopathy.  Review of chart shows pt had labs done with Dr. Nevada Crane on 02/13/2018 that showed WBC 12.3 HB 13.9 plts 211,000.  He had 56% lymphocytes.  Chemistries WNL with K+ 4.5 Cr 1 and normal LFTs.    Labs done 10/01/2018 Flow cytometry returned + for CLL.  Cytogenetics WNL.   Negative BCR/ABL and Jak 2.   Pt has CT CAP done 10/19/2018 that showed  IMPRESSION: There multiple prominent and mildly enlarged lymph nodes within the mediastinum, retroperitoneum and upper abdomen.  Multiple pulmonary nodules as described above measuring up to 8 mm. Non-contrast chest CT at 3-6 months is recommended. If the nodules are stable at time of repeat CT, then future CT at 18-24 months (from today's scan) is considered optional for low-risk patients, but is recommended for high-risk patients  No splenomegaly noted on scan.    Labs done  11/28/2018  showed WBC stable at 19 HB 14.2 plts 239,000.  Chemistries WNL with K+ 3.4 Cr 1 normal LFTs.  Pt will remain on observation due to stable findings and early stage CLL.    Findings on scan more concerning relating to melanoma diagnosis.  He is set up for PET scan on 12/13/2018 at [facility].  Will obtain reports for review.     2  Melanoma.  Pt has chest wall,  scalp and tops of hand lesions.  He reports the chest wall lesion has been present for 2 years.  Pt had a biopsy done by Dr. Nevada Crane on 10/15/2018 that showed Melanoma, superficial spreading.  Breslow Depth 1.2 mm Mitotic index 2  Dr. Nevada Crane referred pt to [facility] for surgical evaluation. Review of chart shows pt had wide excision and SLN mapping done 11/23/2018.  Path showed no residual melanoma with margins free of tumor.  1 right SLN showed microscopic melanoma appearing as a group 0.1 mm.  He is planned for completion dissection on 12/14/2018.    I discussed with him that LN involvement from Melanoma is staged as a stage 3.  PET scan will need to be reviewed due to the lung findings noted on prior CT.  I discussed with him he would be recommended for immunotherapy based on his current status, but it is important to see PET results as well as results of completion dissection.    3.  Pulmonary nodules. Largest 8 MM.  In light of reported Melanoma diagnosis, pt was recommended for PET scan.  Pet is scheduled at [facility] 12/13/2018.  Will follow-up results.    4.  Facial erythema.  Pt reports this occurred after he was admitted by Dr. Saintclair Halsted on 12/30/2015 and discharged on 12/31/2017.  At that time pt had a severe headache  and was found to have SDH and underwent burr hole craniotomy.  He reports since that time he has developed facial erythema and scalp lesions.  Pt has normal EPO, SPEP, iron studies.  Likely due to history of sunburns and melanoma.  5.  Subdural Hematoma (SDH).  Pt was admitted by Dr. Saintclair Halsted on 12/30/2015 and discharged on 12/31/2017 for SDH and underwent burr hole craniotomy.  He denies any Headache or blurred vision currently.  Pt will need MRI of brain due to melanoma diagnosis.  Due to SDH he would not be a candidate for Ibrutinib if therapy ever indicated for CLL due to bleeding risk associated with medication.   6  HTN.  BP 150/63.  Follow-up with PCP.   7.  DM.  Follow-up with PCP.    8.  Sleep apnea.   Follow-up with Sleep medicine as directed.    25 minutes spent with more than 50% spent in review of records, counseling and coordination of care.    Historical data obtained from note dated 10/01/2018.  68 year old male referred for evaluation due to leucocytosis.  He denies smoking.  He reports his mother was told in the past she had an elevated WBC and had workup for leukemia that was reportedly negative.  He denies any Prednisone usage.  He reports he had neurosurgery 3 years ago for a reported bleed.  Since that time he has developed skin lesions on scalp and face and also has one on his chest wall.  He denies taking NSAIDS or ASA currently.  He reports he has been told he has sleep apnea.  He denies any fevers, chills, night sweats and has noted no adenopathy.  Review of chart shows pt had labs done 02/13/2018 that showed WBC 12.3 HB 13.9 plts 211,000.  He had 56% lymphocytes.  Chemistries WNL with K+ 4.5 Cr 1 and normal LFTs.    Current Status:  Pt is seen today for follow-up to go over recent path report.  He is scheduled for PET scan at [facility] on Thursday 12/13/2018 and LN dissection on 12/14/2018.  He is accompanied by Wife.   Problem List Patient Active Problem List   Diagnosis Date Noted  . SDH (subdural hematoma) ([place]) [S06.5X9A] 12/30/2015    Past Medical History Past Medical History:  Diagnosis Date  . BPH (benign prostatic hyperplasia)   . COPD (chronic obstructive pulmonary disease) ([place])   . Diabetes mellitus without complication ([place])   . Hypertension   . Seasonal allergies   . Sleep apnea with use of continuous positive airway pressure (CPAP)    uses CPAP at night     Past Surgical History Past Surgical History:  Procedure Laterality Date  . APPENDECTOMY    . CRANIOTOMY Left 12/30/2015   Procedure: BURR HOLE HEMATOMA EVACUATION SUBDURAL;  Surgeon: Kary Kos, MD;  Location: [place] [facility];  Service: Neurosurgery;  Laterality: Left;  . TONSILLECTOMY      Family History No  family history on file.   Social History  reports that he has never smoked. He has never used smokeless tobacco. He reports that he does not drink alcohol or use drugs.  Medications  Current Outpatient Medications:  .  atorvastatin (LIPITOR) 20 MG tablet, Take 20 mg by mouth daily., Disp: , Rfl:  .  cetirizine (ZYRTEC) 10 MG tablet, Take 10 mg by mouth daily as needed for allergies. , Disp: , Rfl:  .  Dapagliflozin-metFORMIN HCl ER (XIGDUO XR) 03-999 MG TB24, Take by  mouth., Disp: , Rfl:  .  Empagliflozin-metFORMIN HCl ER (SYNJARDY XR) 12.03-999 MG TB24, Take by mouth., Disp: , Rfl:  .  fluticasone (FLONASE) 50 MCG/ACT nasal spray, Place 1 spray into both nostrils daily., Disp: , Rfl:  .  insulin glargine (LANTUS) 100 UNIT/ML injection, Inject 25-35 Units into the skin daily. , Disp: , Rfl:  .  irbesartan-hydrochlorothiazide (AVALIDE) 300-12.5 MG tablet, Take 1 tablet by mouth daily., Disp: , Rfl:  .  meloxicam (MOBIC) 15 MG tablet, , Disp: , Rfl: 1 .  tamsulosin (FLOMAX) 0.4 MG CAPS capsule, Take 0.4 mg by mouth at bedtime. , Disp: , Rfl:   Allergies Penicillins and Sulfa antibiotics  Review of Systems Review of Systems - Oncology ROS negative   Physical Exam  Vitals Wt Readings from Last 3 Encounters:  12/10/18 202 lb (91.6 kg)  11/28/18 203 lb 14.4 oz (92.5 kg)  10/22/18 205 lb (93 kg)   Temp Readings from Last 3 Encounters:  12/10/18 98.6 F (37 C) (Oral)  11/28/18 98.4 F (36.9 C) (Oral)  10/22/18 98.2 F (36.8 C) (Oral)   BP Readings from Last 3 Encounters:  12/10/18 (!) 150/63  11/28/18 (!) 143/65  10/22/18 132/78   Pulse Readings from Last 3 Encounters:  12/10/18 64  11/28/18 65  10/22/18 68   Constitutional: Well-developed, well-nourished, and in no distress.   HENT: Head: Normocephalic and atraumatic.  Mouth/Throat: No oropharyngeal exudate. Mucosa moist. Eyes: Pupils are equal, round, and reactive to light. Conjunctivae are normal. No scleral icterus.   Neck: Normal range of motion. Neck supple. No JVD present.  Cardiovascular: Normal rate, regular rhythm and normal heart sounds.  Exam reveals no gallop and no friction rub.   No murmur heard. Pulmonary/Chest: Effort normal and breath sounds normal. No respiratory distress. No wheezes.No rales.  Abdominal: Soft. Bowel sounds are normal. No distension. There is no tenderness. There is no guarding.  Musculoskeletal: No edema or tenderness.  Lymphadenopathy: No cervical, axillary or supraclavicular adenopathy.  Neurological: Alert and oriented to person, place, and time. No cranial nerve deficit.  Skin: Skin is warm and dry. No rash noted. No erythema. No pallor. Right chest wall incision healing well.   Psychiatric: Affect and judgment normal.   Labs No visits with results within 3 Day(s) from this visit.  Latest known visit with results is:  Appointment on 11/28/2018  Component Date Value Ref Range Status  . LDH 11/28/2018 152  98 - 192 U/L Final   Performed at [facility], [address] [place], [place] [address]  . Sodium 11/28/2018 139  135 - 145 mmol/L Final  . Potassium 11/28/2018 3.4* 3.5 - 5.1 mmol/L Final  . Chloride 11/28/2018 102  98 - 111 mmol/L Final  . CO2 11/28/2018 25  22 - 32 mmol/L Final  . Glucose, Bld 11/28/2018 150* 70 - 99 mg/dL Final  . BUN 11/28/2018 12  8 - 23 mg/dL Final  . Creatinine, Ser 11/28/2018 1.01  0.61 - 1.24 mg/dL Final  . Calcium 11/28/2018 9.5  8.9 - 10.3 mg/dL Final  . Total Protein 11/28/2018 7.8  6.5 - 8.1 g/dL Final  . Albumin 11/28/2018 4.4  3.5 - 5.0 g/dL Final  . AST 11/28/2018 19  15 - 41 U/L Final  . ALT 11/28/2018 16  0 - 44 U/L Final  . Alkaline Phosphatase 11/28/2018 80  38 - 126 U/L Final  . Total Bilirubin 11/28/2018 0.9  0.3 - 1.2 mg/dL Final  . GFR calc non  Af Amer 11/28/2018 >60  >60 mL/min Final  . GFR calc Af Amer 11/28/2018 >60  >60 mL/min Final  . Anion gap 11/28/2018 12  5 - 15 Final   Performed at [facility],  [address] [place], [place] [address]  . WBC 11/28/2018 19.0* 4.0 - 10.5 K/uL Final  . RBC 11/28/2018 5.12  4.22 - 5.81 MIL/uL Final  . Hemoglobin 11/28/2018 14.2  13.0 - 17.0 g/dL Final  . HCT 11/28/2018 45.6  39.0 - 52.0 % Final  . MCV 11/28/2018 89.1  80.0 - 100.0 fL Final  . MCH 11/28/2018 27.7  26.0 - 34.0 pg Final  . MCHC 11/28/2018 31.1  30.0 - 36.0 g/dL Final  . RDW 11/28/2018 13.5  11.5 - 15.5 % Final  . Platelets 11/28/2018 239  150 - 400 K/uL Final  . nRBC 11/28/2018 0.0  0.0 - 0.2 % Final  . Neutrophils Relative % 11/28/2018 25  % Final  . Neutro Abs 11/28/2018 4.7  1.7 - 7.7 K/uL Final  . Lymphocytes Relative 11/28/2018 70  % Final  . Lymphs Abs 11/28/2018 13.3* 0.7 - 4.0 K/uL Final  . Monocytes Relative 11/28/2018 4  % Final  . Monocytes Absolute 11/28/2018 0.7  0.1 - 1.0 K/uL Final  . Eosinophils Relative 11/28/2018 1  % Final  . Eosinophils Absolute 11/28/2018 0.2  0.0 - 0.5 K/uL Final  . Basophils Relative 11/28/2018 0  % Final  . Basophils Absolute 11/28/2018 0.1  0.0 - 0.1 K/uL Final  . WBC Morphology 11/28/2018 ABSOLUTE LYMPHOCYTOSIS   Final  . Immature Granulocytes 11/28/2018 0  % Final  . Abs Immature Granulocytes 11/28/2018 0.05  0.00 - 0.07 K/uL Final  . Reactive, Benign Lymphocytes 11/28/2018 PRESENT   Final  . Smudge Cells 11/28/2018 PRESENT   Final   Performed at [facility], [address] [place], [place] [address]     Pathology No orders of the defined types were placed in this encounter.      Zoila Shutter MD

## 2018-12-13 DIAGNOSIS — C4359 Malignant melanoma of other part of trunk: Secondary | ICD-10-CM | POA: Diagnosis not present

## 2018-12-13 DIAGNOSIS — Z9889 Other specified postprocedural states: Secondary | ICD-10-CM | POA: Diagnosis not present

## 2018-12-13 DIAGNOSIS — Z79899 Other long term (current) drug therapy: Secondary | ICD-10-CM | POA: Diagnosis not present

## 2018-12-14 DIAGNOSIS — G8918 Other acute postprocedural pain: Secondary | ICD-10-CM | POA: Diagnosis not present

## 2018-12-14 DIAGNOSIS — I10 Essential (primary) hypertension: Secondary | ICD-10-CM | POA: Diagnosis not present

## 2018-12-14 DIAGNOSIS — C4359 Malignant melanoma of other part of trunk: Secondary | ICD-10-CM | POA: Diagnosis not present

## 2018-12-14 DIAGNOSIS — L905 Scar conditions and fibrosis of skin: Secondary | ICD-10-CM | POA: Diagnosis not present

## 2018-12-14 DIAGNOSIS — Z794 Long term (current) use of insulin: Secondary | ICD-10-CM | POA: Diagnosis not present

## 2018-12-14 DIAGNOSIS — Z880 Allergy status to penicillin: Secondary | ICD-10-CM | POA: Diagnosis not present

## 2018-12-14 DIAGNOSIS — C439 Malignant melanoma of skin, unspecified: Secondary | ICD-10-CM | POA: Diagnosis not present

## 2018-12-14 DIAGNOSIS — C7989 Secondary malignant neoplasm of other specified sites: Secondary | ICD-10-CM | POA: Diagnosis not present

## 2018-12-14 DIAGNOSIS — Z882 Allergy status to sulfonamides status: Secondary | ICD-10-CM | POA: Diagnosis not present

## 2018-12-14 DIAGNOSIS — E119 Type 2 diabetes mellitus without complications: Secondary | ICD-10-CM | POA: Diagnosis not present

## 2018-12-14 DIAGNOSIS — C4361 Malignant melanoma of right upper limb, including shoulder: Secondary | ICD-10-CM | POA: Diagnosis not present

## 2018-12-14 DIAGNOSIS — C773 Secondary and unspecified malignant neoplasm of axilla and upper limb lymph nodes: Secondary | ICD-10-CM | POA: Diagnosis not present

## 2018-12-14 DIAGNOSIS — Z856 Personal history of leukemia: Secondary | ICD-10-CM | POA: Diagnosis not present

## 2018-12-21 DIAGNOSIS — G4733 Obstructive sleep apnea (adult) (pediatric): Secondary | ICD-10-CM | POA: Diagnosis not present

## 2018-12-24 DIAGNOSIS — G4733 Obstructive sleep apnea (adult) (pediatric): Secondary | ICD-10-CM | POA: Diagnosis not present

## 2018-12-26 ENCOUNTER — Inpatient Hospital Stay (HOSPITAL_COMMUNITY): Payer: Medicare PPO

## 2018-12-26 ENCOUNTER — Other Ambulatory Visit: Payer: Self-pay

## 2018-12-26 ENCOUNTER — Encounter (HOSPITAL_COMMUNITY): Payer: Self-pay | Admitting: Internal Medicine

## 2018-12-26 ENCOUNTER — Inpatient Hospital Stay (HOSPITAL_COMMUNITY): Payer: Medicare PPO | Attending: Internal Medicine | Admitting: Internal Medicine

## 2018-12-26 VITALS — BP 151/61 | HR 65 | Temp 98.4°F | Resp 18 | Wt 202.7 lb

## 2018-12-26 DIAGNOSIS — I10 Essential (primary) hypertension: Secondary | ICD-10-CM | POA: Diagnosis not present

## 2018-12-26 DIAGNOSIS — Z794 Long term (current) use of insulin: Secondary | ICD-10-CM | POA: Diagnosis not present

## 2018-12-26 DIAGNOSIS — N400 Enlarged prostate without lower urinary tract symptoms: Secondary | ICD-10-CM | POA: Diagnosis not present

## 2018-12-26 DIAGNOSIS — C4359 Malignant melanoma of other part of trunk: Secondary | ICD-10-CM | POA: Insufficient documentation

## 2018-12-26 DIAGNOSIS — G4730 Sleep apnea, unspecified: Secondary | ICD-10-CM

## 2018-12-26 DIAGNOSIS — C9110 Chronic lymphocytic leukemia of B-cell type not having achieved remission: Secondary | ICD-10-CM

## 2018-12-26 DIAGNOSIS — Z79899 Other long term (current) drug therapy: Secondary | ICD-10-CM | POA: Insufficient documentation

## 2018-12-26 DIAGNOSIS — E119 Type 2 diabetes mellitus without complications: Secondary | ICD-10-CM | POA: Insufficient documentation

## 2018-12-26 DIAGNOSIS — C434 Malignant melanoma of scalp and neck: Secondary | ICD-10-CM

## 2018-12-26 DIAGNOSIS — C439 Malignant melanoma of skin, unspecified: Secondary | ICD-10-CM

## 2018-12-26 LAB — COMPREHENSIVE METABOLIC PANEL
ALT: 18 U/L (ref 0–44)
AST: 19 U/L (ref 15–41)
Albumin: 4.3 g/dL (ref 3.5–5.0)
Alkaline Phosphatase: 71 U/L (ref 38–126)
Anion gap: 11 (ref 5–15)
BUN: 15 mg/dL (ref 8–23)
CO2: 26 mmol/L (ref 22–32)
Calcium: 8.9 mg/dL (ref 8.9–10.3)
Chloride: 105 mmol/L (ref 98–111)
Creatinine, Ser: 1.01 mg/dL (ref 0.61–1.24)
GFR calc Af Amer: >60 mL/min (ref >60)
GFR calc non Af Amer: >60 mL/min (ref >60)
Glucose, Bld: 147 mg/dL — ABNORMAL HIGH (ref 70–99)
Potassium: 3.5 mmol/L (ref 3.5–5.1)
Sodium: 142 mmol/L (ref 135–145)
Total Bilirubin: 0.7 mg/dL (ref 0.3–1.2)
Total Protein: 7.3 g/dL (ref 6.5–8.1)

## 2018-12-26 LAB — CBC WITH DIFFERENTIAL/PLATELET
Abs Immature Granulocytes: 0.05 10*3/uL (ref 0.00–0.07)
Basophils Absolute: 0.1 10*3/uL (ref 0.0–0.1)
Basophils Relative: 0 %
Eosinophils Absolute: 0.1 10*3/uL (ref 0.0–0.5)
Eosinophils Relative: 1 %
HEMATOCRIT: 43.2 % (ref 39.0–52.0)
Hemoglobin: 13.3 g/dL (ref 13.0–17.0)
Immature Granulocytes: 0 %
Lymphocytes Relative: 70 %
Lymphs Abs: 13.5 10*3/uL — ABNORMAL HIGH (ref 0.7–4.0)
MCH: 27.8 pg (ref 26.0–34.0)
MCHC: 30.8 g/dL (ref 30.0–36.0)
MCV: 90.4 fL (ref 80.0–100.0)
Monocytes Absolute: 0.6 10*3/uL (ref 0.1–1.0)
Monocytes Relative: 3 %
Neutro Abs: 4.9 10*3/uL (ref 1.7–7.7)
Neutrophils Relative %: 26 %
Platelets: 233 10*3/uL (ref 150–400)
RBC: 4.78 MIL/uL (ref 4.22–5.81)
RDW: 13.4 % (ref 11.5–15.5)
WBC: 19.3 10*3/uL — ABNORMAL HIGH (ref 4.0–10.5)
nRBC: 0 % (ref 0.0–0.2)

## 2018-12-26 LAB — LACTATE DEHYDROGENASE: LDH: 128 U/L (ref 98–192)

## 2018-12-26 NOTE — Patient Instructions (Signed)
[facility] at [facility] Discharge Instructions  You were seen by Dr. Higgs today   Thank you for choosing [facility] at [facility] to provide your oncology and hematology care.  To afford each patient quality time with our provider, please arrive at least 15 minutes before your scheduled appointment time.   If you have a lab appointment with the Cancer Center please come in thru the  Main Entrance and check in at the main information desk  You need to re-schedule your appointment should you arrive 10 or more minutes late.  We strive to give you quality time with our providers, and arriving late affects you and other patients whose appointments are after yours.  Also, if you no show three or more times for appointments you may be dismissed from the clinic at the providers discretion.     Again, thank you for choosing [facility].  Our hope is that these requests will decrease the amount of time that you wait before being seen by our physicians.       _____________________________________________________________  Should you have questions after your visit to [facility], please contact our office at [phone] between the hours of 8:00 a.m. and 4:30 p.m.  Voicemails left after 4:00 p.m. will not be returned until the following business day.  For prescription refill requests, have your pharmacy contact our office and allow 72 hours.    Cancer Center Support Programs:   > Cancer Support Group  2nd Tuesday of the month 1pm-2pm, Journey Room   

## 2018-12-26 NOTE — Progress Notes (Signed)
Diagnosis Melanoma of skin ([place]) - Plan: CBC with Differential/Platelet, Comprehensive metabolic panel, Lactate dehydrogenase, T4 AND TSH, Hepatitis panel, acute, HIV Antibody (routine testing w rflx), Hepatitis B core antibody, total, Hepatitis B surface antibody,qualitative, Hepatitis B surface antigen, Hepatitis C RNA quantitative, MR Brain W Wo Contrast  Staging Cancer Staging No matching staging information was found for the patient.  Assessment and Plan:  1. CLL stage 75-25.    68 year old male referred for evaluation due to leucocytosis.  He denies smoking.  He reports his mother was told in the past she had an elevated WBC and had work-up for leukemia that was reportedly negative.  He denies any Prednisone usage.  He reports he had neurosurgery 3 years ago for a reported bleed.  Since that time he has developed skin lesions on scalp and face and also has one on his chest wall.  He denies taking NSAIDS or ASA currently.  He reports he has been told he has sleep apnea.  He denies any fevers, chills, night sweats and has noted no adenopathy.  Review of chart shows pt had labs done with Dr. Nevada Crane on 02/13/2018 that showed WBC 12.3 HB 13.9 plts 211,000.  He had 56% lymphocytes.  Chemistries WNL with K+ 4.5 Cr 1 and normal LFTs.    Labs done 10/01/2018 Flow cytometry returned + for CLL.  Cytogenetics WNL.   Negative BCR/ABL and Jak 2.   Pt has CT CAP done 10/19/2018 that showed  IMPRESSION: There multiple prominent and mildly enlarged lymph nodes within the mediastinum, retroperitoneum and upper abdomen.  Multiple pulmonary nodules as described above measuring up to 8 mm. Non-contrast chest CT at 3-6 months is recommended. If the nodules are stable at time of repeat CT, then future CT at 18-24 months (from today's scan) is considered optional for low-risk patients, but is recommended for high-risk patients  No splenomegaly noted on scan.    Labs done today 12/26/2018 reviewed and showed WBC  19.3 HB 13.3 plts 233,000.  Chemistries WNL with K+ 3.5 Cr 1 and normal LFTs.  LDH 128  11/28/2018  showed WBC stable at 19 HB 14.2 plts 239,000.  Chemistries WNL Awaiting hepatitis, HIV testing and TFts.   Pt will remain on observation due to stable findings and early stage CLL.    Findings on scan more concerning relating to melanoma diagnosis.   2  Melanoma stage 3.  Pt has chest wall, scalp and tops of hand lesions.  He reports the chest wall lesion has been present for 2 years.  Pt had a biopsy done by Dr. Nevada Crane on 10/15/2018 that showed Melanoma, superficial spreading.  Breslow Depth 1.2 mm Mitotic index 2  Dr. Nevada Crane referred pt to [facility] for surgical evaluation. Review of chart shows pt had wide excision and SLN mapping done 11/23/2018.  Path showed no residual melanoma with margins free of tumor.  1 right SLN showed microscopic melanoma appearing as a group 0.1 mm.  He underwent completion dissection on 12/14/2018 with pathology returning as 28 LN negative for malignancy.  9 SLN negative for malignancy.    Previously, I discussed with him that LN involvement from Melanoma is staged as a stage 3.  [facility] scan done 12/13/2018 [facility] showed no suspicious lesions and evidence of prior surgery.    Due to SDH history and melanoma diagnosis, he will be set up for MRI of brain to complete staging.  Will also request melanoma biomarker testing.    Long talk  held with pt and wife regarding LN involvement increases risk for recurrence.  Clinical trials have shown recurrence-free survival was significantly increased with Nivolumab compared with IPI. Toxicity was significantly decreased with Nivo versus IPI.  Clement Husbands is a standard recommendation for stage 3 melanoma in in the adjuvant setting.  Nivo is dosed at 240 mg IV every 2 weeks or 480 mg every 4 weeks for 1 year of therapy.  Pt will be recommended for every 2 week regimen initially  to assess tolerance of therapy.  Side effects of the medication were reviewed with pt  which include but are not limited to:  Immune-mediated Pneumonitis  Immune-mediated Colitis Immune-mediated Hepatitis Immune-medicated Nephritis and Renal Dysfunction  Immune-mediated Hypothyroidism and Hyperthyroidism  He is set up for port placement.   I have discussed with him drain would have to be out and pt healed from surgery prior to beginning therapy.  Pt was provided written information regarding Nivo.  Goals of therapy reviewed with pt which are disease control and to decrease risk of recurrence.  All question answered and pt and wife expressed understanding of the information presented.    3.  Pulmonary nodules. Largest 8 MM.  In light of reported Melanoma diagnosis, pt was recommended for PET scan.  Pet was done at [facility] on 12/13/2018 with evidence of prior surgery but no lung nodules or evidence of distant disease.    4.  Facial erythema.  Pt reports this occurred after he was admitted by Dr. Saintclair Halsted on 12/30/2015 and discharged on 12/31/2017.  At that time pt had a severe headache and was found to have SDH and underwent burr hole craniotomy.  He reports since that time he has developed facial erythema and scalp lesions.  Pt has normal EPO, SPEP, iron studies.  Likely due to history of sunburns and melanoma.    5.  Subdural Hematoma (SDH).  Pt was admitted by Dr. Saintclair Halsted on 12/30/2015 and discharged on 12/31/2017 for SDH and underwent burr hole craniotomy.  He denies any Headache or blurred vision currently.  Pt is set up for MRI of brain due to melanoma diagnosis to complete staging evaluation.  Due to SDH he would not be a candidate for Ibrutinib if therapy ever indicated for CLL due to bleeding risk associated with medication.   6  HTN.  BP 151/61.  Follow-up with PCP.   7.  DM.  Follow-up with PCP.    8.  Sleep apnea.  Follow-up with Sleep medicine as directed.    9.  Pain and numbness.  This is expected due to recent surgery.  He reports he was given pain medications at [facility].    25  minutes spent with more than 50% spent in review of records, counseling and coordination of care.    Historical data obtained from note dated 10/01/2018.  68 year old male referred for evaluation due to leucocytosis.  He denies smoking.  He reports his mother was told in the past she had an elevated WBC and had workup for leukemia that was reportedly negative.  He denies any Prednisone usage.  He reports he had neurosurgery 3 years ago for a reported bleed.  Since that time he has developed skin lesions on scalp and face and also has one on his chest wall.  He denies taking NSAIDS or ASA currently.  He reports he has been told he has sleep apnea.  He denies any fevers, chills, night sweats and has noted no adenopathy.  Review of  chart shows pt had labs done 02/13/2018 that showed WBC 12.3 HB 13.9 plts 211,000.  He had 56% lymphocytes.  Chemistries WNL with K+ 4.5 Cr 1 and normal LFTs.    Current Status:  Pt is seen today for follow-up to go PET scan and pathology.  He has undergone completion dissection at [facility] and reports pain and numbness.  He still has drain in place. He is accompanied by Wife.   Problem List Patient Active Problem List   Diagnosis Date Noted  . SDH (subdural hematoma) ([place]) [S06.5X9A] 12/30/2015    Past Medical History Past Medical History:  Diagnosis Date  . BPH (benign prostatic hyperplasia)   . COPD (chronic obstructive pulmonary disease) ([place])   . Diabetes mellitus without complication ([place])   . Hypertension   . Seasonal allergies   . Sleep apnea with use of continuous positive airway pressure (CPAP)    uses CPAP at night     Past Surgical History Past Surgical History:  Procedure Laterality Date  . APPENDECTOMY    . CRANIOTOMY Left 12/30/2015   Procedure: BURR HOLE HEMATOMA EVACUATION SUBDURAL;  Surgeon: Kary Kos, MD;  Location: [facility];  Service: Neurosurgery;  Laterality: Left;  . TONSILLECTOMY      Family History History reviewed. No pertinent family  history.   Social History  reports that he has never smoked. He has never used smokeless tobacco. He reports that he does not drink alcohol or use drugs.  Medications  Current Outpatient Medications:  .  Polyethylene Glycol 3350 (PEG 3350) POWD, Take by mouth., Disp: , Rfl:  .  amLODipine (NORVASC) 10 MG tablet, , Disp: , Rfl:  .  atorvastatin (LIPITOR) 20 MG tablet, Take 20 mg by mouth daily., Disp: , Rfl:  .  cetirizine (ZYRTEC) 10 MG tablet, Take 10 mg by mouth daily as needed for allergies. , Disp: , Rfl:  .  Dapagliflozin-metFORMIN HCl ER (XIGDUO XR) 03-999 MG TB24, Take by mouth., Disp: , Rfl:  .  Empagliflozin-metFORMIN HCl ER (SYNJARDY XR) 12.03-999 MG TB24, Take by mouth., Disp: , Rfl:  .  fluticasone (FLONASE) 50 MCG/ACT nasal spray, Place 1 spray into both nostrils daily., Disp: , Rfl:  .  HYDROcodone-acetaminophen (NORCO/VICODIN) 5-325 MG tablet, , Disp: , Rfl:  .  insulin glargine (LANTUS) 100 UNIT/ML injection, Inject 25-35 Units into the skin daily. , Disp: , Rfl:  .  irbesartan-hydrochlorothiazide (AVALIDE) 300-12.5 MG tablet, Take 1 tablet by mouth daily., Disp: , Rfl:  .  LANTUS SOLOSTAR 100 UNIT/ML Solostar Pen, , Disp: , Rfl:  .  meloxicam (MOBIC) 15 MG tablet, , Disp: , Rfl: 1 .  tamsulosin (FLOMAX) 0.4 MG CAPS capsule, Take 0.4 mg by mouth at bedtime. , Disp: , Rfl:   Allergies Penicillins; Sulfa antibiotics; and Oxycodone  Review of Systems Review of Systems - Oncology ROS negative   Physical Exam  Vitals Wt Readings from Last 3 Encounters:  12/26/18 202 lb 11.2 oz (91.9 kg)  12/10/18 202 lb (91.6 kg)  11/28/18 203 lb 14.4 oz (92.5 kg)   Temp Readings from Last 3 Encounters:  12/26/18 98.4 F (36.9 C) (Oral)  12/10/18 98.6 F (37 C) (Oral)  11/28/18 98.4 F (36.9 C) (Oral)   BP Readings from Last 3 Encounters:  12/26/18 (!) 151/61  12/10/18 (!) 150/63  11/28/18 (!) 143/65   Pulse Readings from Last 3 Encounters:  12/26/18 65  12/10/18 64   11/28/18 65   Constitutional: Well-developed, well-nourished, and in no distress.  HENT: Head: Normocephalic and atraumatic.  Mouth/Throat: No oropharyngeal exudate. Mucosa moist. Eyes: Pupils are equal, round, and reactive to light. Conjunctivae are normal. No scleral icterus.  Neck: Normal range of motion. Neck supple. No JVD present.  Cardiovascular: Normal rate, regular rhythm and normal heart sounds.  Exam reveals no gallop and no friction rub.   No murmur heard. Pulmonary/Chest: Effort normal and breath sounds normal. No respiratory distress. No wheezes.No rales.  Abdominal: Soft. Bowel sounds are normal. No distension. There is no tenderness. There is no guarding.  Musculoskeletal: No edema or tenderness.  Lymphadenopathy: No cervical, axillary or supraclavicular adenopathy.  Neurological: Alert and oriented to person, place, and time. No cranial nerve deficit.  Skin: Skin is warm and dry. No rash noted. No erythema. No pallor. Incision sites noted on right chest wall and axillary area with drain in place with serosanguineous fluid noted.   Psychiatric: Affect and judgment normal.   Labs Appointment on 12/26/2018  Component Date Value Ref Range Status  . WBC 12/26/2018 19.3* 4.0 - 10.5 K/uL Final  . RBC 12/26/2018 4.78  4.22 - 5.81 MIL/uL Final  . Hemoglobin 12/26/2018 13.3  13.0 - 17.0 g/dL Final  . HCT 12/26/2018 43.2  39.0 - 52.0 % Final  . MCV 12/26/2018 90.4  80.0 - 100.0 fL Final  . MCH 12/26/2018 27.8  26.0 - 34.0 pg Final  . MCHC 12/26/2018 30.8  30.0 - 36.0 g/dL Final  . RDW 12/26/2018 13.4  11.5 - 15.5 % Final  . Platelets 12/26/2018 233  150 - 400 K/uL Final  . nRBC 12/26/2018 0.0  0.0 - 0.2 % Final  . Neutrophils Relative % 12/26/2018 26  % Final  . Neutro Abs 12/26/2018 4.9  1.7 - 7.7 K/uL Final  . Lymphocytes Relative 12/26/2018 70  % Final  . Lymphs Abs 12/26/2018 13.5* 0.7 - 4.0 K/uL Final  . Monocytes Relative 12/26/2018 3  % Final  . Monocytes Absolute  12/26/2018 0.6  0.1 - 1.0 K/uL Final  . Eosinophils Relative 12/26/2018 1  % Final  . Eosinophils Absolute 12/26/2018 0.1  0.0 - 0.5 K/uL Final  . Basophils Relative 12/26/2018 0  % Final  . Basophils Absolute 12/26/2018 0.1  0.0 - 0.1 K/uL Final  . WBC Morphology 12/26/2018 ABSOLUTE LYMPHOCYTOSIS   Final  . Immature Granulocytes 12/26/2018 0  % Final  . Abs Immature Granulocytes 12/26/2018 0.05  0.00 - 0.07 K/uL Final  . Reactive, Benign Lymphocytes 12/26/2018 PRESENT   Final  . Smudge Cells 12/26/2018 PRESENT   Final   Performed at [facility], [address] [place], [place] [address]  . Sodium 12/26/2018 142  135 - 145 mmol/L Final  . Potassium 12/26/2018 3.5  3.5 - 5.1 mmol/L Final  . Chloride 12/26/2018 105  98 - 111 mmol/L Final  . CO2 12/26/2018 26  22 - 32 mmol/L Final  . Glucose, Bld 12/26/2018 147* 70 - 99 mg/dL Final  . BUN 12/26/2018 15  8 - 23 mg/dL Final  . Creatinine, Ser 12/26/2018 1.01  0.61 - 1.24 mg/dL Final  . Calcium 12/26/2018 8.9  8.9 - 10.3 mg/dL Final  . Total Protein 12/26/2018 7.3  6.5 - 8.1 g/dL Final  . Albumin 12/26/2018 4.3  3.5 - 5.0 g/dL Final  . AST 12/26/2018 19  15 - 41 U/L Final  . ALT 12/26/2018 18  0 - 44 U/L Final  . Alkaline Phosphatase 12/26/2018 71  38 - 126 U/L Final  . Total  Bilirubin 12/26/2018 0.7  0.3 - 1.2 mg/dL Final  . GFR calc non Af Amer 12/26/2018 >60  >60 mL/min Final  . GFR calc Af Amer 12/26/2018 >60  >60 mL/min Final  . Anion gap 12/26/2018 11  5 - 15 Final   Performed at [facility], [address], [place], [place] [address]  . LDH 12/26/2018 128  98 - 192 U/L Final   Performed at [facility], [address], [place], [place] [address]     Pathology Orders Placed This Encounter  Procedures  . MR Brain W Wo Contrast    Standing Status:   Future    Standing Expiration Date:   12/26/2019    Order Specific Question:   ** REASON FOR EXAM (FREE TEXT)    Answer:   SDH history.  Pt had burr hole done    Order Specific  Question:   If indicated for the ordered procedure, I authorize the administration of contrast media per Radiology protocol    Answer:   Yes    Order Specific Question:   What is the patient's sedation requirement?    Answer:   No Sedation    Order Specific Question:   Does the patient have a pacemaker or implanted devices?    Answer:   No    Order Specific Question:   Use SRS Protocol?    Answer:   No    Order Specific Question:   Radiology Contrast Protocol - do NOT remove file path    Answer:   [email or web address]    Order Specific Question:   Preferred imaging location?    Answer:   [facility] (table limit-350lbs)  . CBC with Differential/Platelet    Standing Status:   Future    Number of Occurrences:   1    Standing Expiration Date:   12/27/2019  . Comprehensive metabolic panel    Standing Status:   Future    Number of Occurrences:   1    Standing Expiration Date:   12/27/2019  . Lactate dehydrogenase    Standing Status:   Future    Number of Occurrences:   1    Standing Expiration Date:   12/27/2019  . T4 AND TSH    Standing Status:   Future    Number of Occurrences:   1    Standing Expiration Date:   12/27/2019  . Hepatitis panel, acute    Standing Status:   Future    Number of Occurrences:   1    Standing Expiration Date:   12/27/2019  . HIV Antibody (routine testing w rflx)    Standing Status:   Future    Number of Occurrences:   1    Standing Expiration Date:   12/27/2019  . Hepatitis B core antibody, total    Standing Status:   Future    Number of Occurrences:   1    Standing Expiration Date:   12/27/2019  . Hepatitis B surface antibody,qualitative    Standing Status:   Future    Number of Occurrences:   1    Standing Expiration Date:   12/27/2019  . Hepatitis B surface antigen    Standing Status:   Future    Number of Occurrences:   1    Standing Expiration Date:   12/27/2019  . Hepatitis C RNA quantitative    Standing Status:    Future    Number of Occurrences:   1    Standing  Expiration Date:   12/27/2019       Zoila Shutter MD

## 2018-12-27 ENCOUNTER — Ambulatory Visit: Payer: Medicare PPO | Admitting: General Surgery

## 2018-12-27 ENCOUNTER — Encounter: Payer: Self-pay | Admitting: General Surgery

## 2018-12-27 ENCOUNTER — Encounter (HOSPITAL_COMMUNITY): Payer: Self-pay | Admitting: *Deleted

## 2018-12-27 VITALS — BP 153/70 | HR 59 | Temp 98.4°F | Resp 20 | Wt 203.2 lb

## 2018-12-27 DIAGNOSIS — C4359 Malignant melanoma of other part of trunk: Secondary | ICD-10-CM | POA: Diagnosis not present

## 2018-12-27 DIAGNOSIS — G4733 Obstructive sleep apnea (adult) (pediatric): Secondary | ICD-10-CM | POA: Diagnosis not present

## 2018-12-27 LAB — HEPATITIS PANEL, ACUTE
HCV Ab: <0.1 s/co ratio (ref 0.0–0.9)
Hep A IgM: NEGATIVE
Hep B C IgM: NEGATIVE
Hepatitis B Surface Ag: NEGATIVE

## 2018-12-27 LAB — HEPATITIS B SURFACE ANTIBODY,QUALITATIVE: Hep B S Ab: NONREACTIVE

## 2018-12-27 LAB — HEPATITIS B CORE ANTIBODY, TOTAL: Hep B Core Total Ab: NEGATIVE

## 2018-12-27 LAB — HIV ANTIBODY (ROUTINE TESTING W REFLEX): HIV Screen 4th Generation wRfx: NONREACTIVE

## 2018-12-27 LAB — HEPATITIS B SURFACE ANTIGEN: Hepatitis B Surface Ag: NEGATIVE

## 2018-12-27 NOTE — Progress Notes (Signed)
Please notify pt testing negative

## 2018-12-27 NOTE — Progress Notes (Signed)
Mark Huang; [id number]; 13-Sep-1951   HPI Patient is a 68 year old white male who was referred to my care by oncology and Dr. Nevada Crane for Port-A-Cath placement.  He has a right chest wall melanoma and is about to undergo immunotherapy for treatment.  He recently had right axillary lymph node dissection and right chest wall melanoma excision at [facility].  He is still being followed by them for the postoperative care.  He has 4 out of 10 right arm and incisional pain. Past Medical History:  Diagnosis Date  . BPH (benign prostatic hyperplasia)   . COPD (chronic obstructive pulmonary disease) ([place])   . Diabetes mellitus without complication ([employer])   . Hypertension   . Seasonal allergies   . Sleep apnea with use of continuous positive airway pressure (CPAP)    uses CPAP at night     Past Surgical History:  Procedure Laterality Date  . APPENDECTOMY    . CRANIOTOMY Left 12/30/2015   Procedure: BURR HOLE HEMATOMA EVACUATION SUBDURAL;  Surgeon: Kary Kos, MD;  Location: [place] [facility];  Service: Neurosurgery;  Laterality: Left;  . TONSILLECTOMY      History reviewed. No pertinent family history.  Current Outpatient Medications on File Prior to Visit  Medication Sig Dispense Refill  . ibuprofen (ADVIL,MOTRIN) 800 MG tablet Take 800 mg by mouth every 8 (eight) hours as needed.    Marland Kitchen amLODipine (NORVASC) 10 MG tablet     . atorvastatin (LIPITOR) 20 MG tablet Take 20 mg by mouth daily.    . cetirizine (ZYRTEC) 10 MG tablet Take 10 mg by mouth daily as needed for allergies.     . Dapagliflozin-metFORMIN HCl ER (XIGDUO XR) 03-999 MG TB24 Take by mouth.    . Empagliflozin-metFORMIN HCl ER (SYNJARDY XR) 12.03-999 MG TB24 Take by mouth.    . fluticasone (FLONASE) 50 MCG/ACT nasal spray Place 1 spray into both nostrils daily.    Marland Kitchen HYDROcodone-acetaminophen (NORCO/VICODIN) 5-325 MG tablet     . insulin glargine (LANTUS) 100 UNIT/ML injection Inject 25-35 Units into the skin daily.     .  irbesartan-hydrochlorothiazide (AVALIDE) 300-12.5 MG tablet Take 1 tablet by mouth daily.    Marland Kitchen LANTUS SOLOSTAR 100 UNIT/ML Solostar Pen     . tamsulosin (FLOMAX) 0.4 MG CAPS capsule Take 0.4 mg by mouth at bedtime.      No current facility-administered medications on file prior to visit.     Allergies  Allergen Reactions  . Penicillins     Has patient had a PCN reaction causing immediate rash, facial/tongue/throat swelling, SOB or lightheadedness with hypotension: unknown Has patient had a PCN reaction causing severe rash involving mucus membranes or skin necrosis: unknown Has patient had a PCN reaction that required hospitalization: unknown Has patient had a PCN reaction occurring within the last 10 years: No If all of the above answers are "NO", then may proceed with  . Sulfa Antibiotics   . Oxycodone Nausea And Vomiting    Social History   Substance and Sexual Activity  Alcohol Use No    Social History   Tobacco Use  Smoking Status Never Smoker  Smokeless Tobacco Never Used    Review of Systems  Constitutional: Negative.   HENT: Negative.   Eyes: Negative.   Respiratory: Negative.   Cardiovascular: Negative.   Gastrointestinal: Negative.   Genitourinary: Positive for frequency.  Musculoskeletal: Negative.   Skin: Negative.   Neurological: Negative.   Endo/Heme/Allergies: Negative.   Psychiatric/Behavioral: Negative.  Objective   Vitals:   12/27/18 1112  BP: (!) 153/70  Pulse: (!) 59  Resp: 20  Temp: 98.4 F (36.9 C)    Physical Exam Vitals signs reviewed.  Constitutional:      Appearance: Normal appearance. He is not ill-appearing.  HENT:     Head: Normocephalic and atraumatic.  Cardiovascular:     Rate and Rhythm: Normal rate and regular rhythm.     Heart sounds: Normal heart sounds. No murmur. No friction rub. No gallop.   Pulmonary:     Effort: Pulmonary effort is normal. No respiratory distress.     Breath sounds: Normal breath sounds. No  stridor. No wheezing, rhonchi or rales.  Skin:    General: Skin is warm and dry.  Neurological:     Mental Status: He is alert and oriented to person, place, and time.    Oncology notes reviewed. Assessment  Melanoma, need for central venous access Plan   Scheduled for Portacath insertion on 01/09/19.  The risks and benefits of procedure including bleeding infection, and pneumothorax were fully explained to the patient, who gives informed consent.

## 2018-12-27 NOTE — Progress Notes (Signed)
I spoke with Almyra Free at [facility] and ordered foundation one specifically looking for BRAF mutations on Accession # [id number].

## 2018-12-27 NOTE — Patient Instructions (Signed)
Ravinia will see the doctor regularly throughout treatment.  We monitor your lab work prior to every treatment. The doctor monitors your response to treatment by the way you are feeling, your blood work, and scans periodically.  There will be wait times while you are here for treatment.  It will take about 30 minutes to 1 hour for your lab work to result.  Then there will be wait times while pharmacy mixes your medications.   [facility]        Generic name : Ipilimumab   How Yervoy  Is Given:  Mark Huang is given as an intravenous injection through a vein (IV). It helps your immune system work properly to target and kill cancer cells.  Side Effects:   Most people do not experience all of the side effects listed.  Side effects are often predictable in terms of their onset and duration.  A few side effects can occur weeks or months after discontinuation of treatment.  There are many options to help manage and prevent worsening of side effects.   The following side effects are common (occurring in greater than 30%) for patients taking Yervoy:   -Fatigue  -Diarrhea  -Itching  -Rash  -Nausea/vomiting  -These side effects are less common side effects (occurring in about 10-29%) of patients receiving Yervoy:  -Decreased appetite  -Constipation  -Cough  -Headache  -Abdominal pain  -Shortness of breath  -Anemia  -Fever   A serious, but uncommon side effect of Yervoy may be an immune-mediated reaction. When this side effect occurs, it affects primarily the bowels, liver, skin, nerves and the endocrine system. This immune reaction can occur during treatment but can also be seen weeks or months after discontinuation of treatment. Symptoms of this reaction will be monitored throughout treatment (diarrhea, rash, and neuropathy). Lab work will check for elevated liver enzymes and thyroid function.   Contact your health care provider right away if you  have any new or worsening symptoms.   Not all side effects are listed above. Some that are rare (occurring in less than 10% of patients) are not listed here. However, you should always inform your health care provider if you experience any unusual symptoms.   When to contact your doctor or health care provider:  Contact your health care provider immediately, day or night, if you should experience any of the following symptoms:   -Fever of 100.4 F (38 or higher)  -Chills  -Signs of reaction to the drug (wheezing, chest tightness, itching, bad cough, swelling of the face, lips, tongue or throat).  -Diarrhea  -Blood in your stools or dark, tarry, sticky stools  -Stomach pain or tenderness, especially right side  -Nausea (interferes with ability to eat and unrelieved with prescribed medication)  -Vomiting (vomiting more than 4-5 times in a 24 hour period)  -Unable to eat or drink for 24 hours or have signs of dehydration: tiredness, thirst, dry mouth, dark and decrease amount of urine, or dizziness  -Skin or the whites of your eyes turn yellow  -Urine turns dark or brown (tea color)  -Decreased appetite  -Bleed or bruise more easily than normal  -Skin rash with or without itching  -Sores in the mouth  -Skin blisters and/or peels  -Unusual weakness of legs, arms or face  -Numbness or tingling in hands or feet  -Persistent or unusual headaches  -Unusual sluggishness, feeling cold all the time, or weight gain  -Changes in mood  or behavior such as decreased sex drive, irritability or forgetfulness  -Dizziness or fainting  -Blurry vision, double vision or other vision problems  -Eye pain or redness   Always inform your health care provider if you experience any unusual symptoms.   Precautions:  Before starting Yervoy treatment, make sure you tell your doctor about any other medications you are taking (including prescription, over-the-counter, vitamins, herbal remedies, etc.).  Do not  receive any kind of immunization or vaccination without your doctor's approval while taking Yervoy.  Inform your health care professional if you are pregnant or may be pregnant prior to starting this treatment. Pregnancy category C Mark Huang may be hazardous to the fetus. Women who are pregnant or become pregnant must be advised of the potential hazard to the fetus.)  For both men and women: Do not conceive a child (get pregnant) while taking Yervoy. Barrier methods of contraception, such as condoms, are recommended. Discuss with your doctor when you may safely become pregnant or conceive a child after therapy.  Do not breast feed while taking this medication.   Nivolumab (Opdivo)  About This Drug  Nivolumab is used to treat cancer. It is given in the vein (IV).  Possible Side Effects  . Bone marrow depression. This is a decrease in the number of white blood cells, red blood cells, and platelets. This may raise your risk of infection, make you tired and weak (fatigue), and raise your risk of bleeding.  . Tiredness and weakness  . Joint, muscle and bone pain  . Back pain  . Loose bowel movements (diarrhea)  . Nausea  . Decreased appetite (decreased hunger)  . Constipation (not able to move bowels)  . Cough and trouble breathing  . Upper respiratory infection  . Fever  . Rash and itching  . Electrolyte changes  Note: Each of the side effects above was reported in 20% or greater of patients treated with nivolumab. Not all possible side effects are included above. Your side effects may be different or more severe if you receive nivolumab in combination with other chemotherapy agents.  Warnings and Precautions  . This drug works with your immune system and can cause inflammation in any of your organs and tissues and can change how they work. This may put you at risk for developing serious medical problems which can very rarely be fatal.  . Colitis (swelling (inflammation)  in the colon) - symptoms are loose bowel movements (diarrhea) stomach cramping, and sometimes blood in the bowel movements  . Changes in liver function  . Changes in kidney function  . Inflammation (swelling) of the lungs which can very rarely be fatal - you may have a dry cough or trouble breathing.  . This drug may affect some of your hormone glands (especially the thyroid, adrenals, pituitary and pancreas).  . Blood sugar levels may change and you may develop diabetes. If you already have diabetes, changes may need to be made to your diabetes medication.  . Severe allergic skin reaction which can very rarely be fatal. You may develop blisters on your skin that are filled with fluid or a severe red rash all over your body that may be painful.  . Changes in your central nervous system can happen. The central nervous system is made up of your brain and spinal cord. You could feel extreme tiredness, agitation, confusion, hallucinations (see or hear things that are not there), trouble understanding or speaking, loss of control of your bowels or bladder, eyesight changes,  numbness or lack of strength to your arms, legs, face, or body, and coma. If you start to have any of these symptoms let your doctor know right away.  . While you are getting this drug in your vein (IV), you may have a reaction to the drug. Sometimes you may be given medication to stop or lessen these side effects. Your nurse will check you closely for these signs: fever or shaking chills, flushing, facial swelling, feeling dizzy, headache, trouble breathing, rash, itching, chest tightness, or chest pain. These reactions may happen after your infusion. If this happens, call 911 for emergency care.  . Increased risk of complications, which may very rarely be fatal, in patients who will undergo a stem cell transplant after receiving nivolumab.  Important Information  . This drug may be present in the saliva, tears, sweat, urine,  stool, vomit, semen, and vaginal secretions. Talk to your doctor and/or your nurse about the necessary precautions to take during this time.  Treating Side Effects  . To decrease infection, wash your hands regularly  . Avoid close contact with people who have a cold, the flu, or other infections.  . Take your temperature as your doctor or nurse tells you, and whenever you feel like you may have a fever  . To help decrease bleeding, use a soft toothbrush. Check with your nurse before using dental floss.  . Be very careful when using knives or tools  . Use an electric shaver instead of a razor  . Ask your doctor or nurse about medicines that are available to help stop or lessen constipation, diarrhea and/or nausea.  . Drink plenty of fluids (a minimum of eight glasses per day is recommended).  . If you are not able to move your bowels, check with your doctor or nurse before you use any enemas, laxatives, or suppositories  . To help with nausea and vomiting, eat small, frequent meals instead of three large meals a day. Choose foods and drinks that are at room temperature. Ask your nurse or doctor about other helpful tips and medicine that is available to help or stop lessen these symptoms.  . If you get diarrhea, eat low-fiber foods that are high in protein and calories and avoid foods that can irritate your digestive tracts or lead to cramping. Ask your nurse or doctor about medicine that can lessen or stop your diarrhea.  . To help with decreased appetite, eat small, frequent meals  . Eat high caloric food such as pudding, ice cream, yogurt and milkshakes.  . Manage tiredness by pacing your activities for the day. Be sure to include periods of rest between energy-draining activities  . Keeping your pain under control is important to your wellbeing. Please tell your doctor or nurse if you are experiencing pain.  . If you have diabetes, keep good control of your blood sugar level. Tell  your nurse or your doctor if your glucose levels are higher or lower than normal  . If you get a rash do not put anything on it unless your doctor or nurse says you may. Keep the area around the rash clean and dry. Ask your doctor for medicine if your rash bothers you.  . Infusion reactions may occur after your infusion. If this happens, call 911 for emergency care.  Food and Drug Interactions  . There are no known interactions of nivolumab with food or other medications.  . Tell your doctor and pharmacist about all the medicines and dietary supplements (  vitamins, minerals, herbs and others) that you are taking at this time. The safety and use of dietary supplements and alternative diets are often not known. Using these might affect your cancer or interfere with your treatment. Until more is known, you should not use dietary supplements or alternative diets without your cancer doctor's help.  When to Call the Doctor  Call your doctor or nurse if you have any of these symptoms and/or any new or unusual symptoms:  . Fever of 100.5 F (38 C) or higher  . Chills  . Easy bleeding or bruising  . Wheezing or trouble breathing  . Dry cough, or cough with yellow, green or bloody mucus  . Confusion and/or agitation  . Hallucinations  . Trouble understanding or speaking  . Blurry vision or changes in your eyesight  . Numbness or lack of strength to your arms, legs, face, or body  . Feeling dizzy or lightheaded  . Loose bowel movements (diarrhea) more than 4 times a day or diarrhea with weakness or lightheadedness  . Nausea that stops you from eating or drinking, and/or that is not relieved by prescribed medicines  . Lasting loss of appetite or rapid weight loss of five pounds in a week  . No bowel movement for 3 days or you feel uncomfortable  . Bad abdominal pain, especially in upper right area  . Fatigue or extreme weakness that interferes with normal activities  . Decreased  urine  . Unusual thirst or passing urine often  . Rash that is not relieved by prescribed medicines  . Rash or itching  . Flu-like symptoms: fever, headache, muscle and joint aches, and fatigue (low energy, feeling weak)  . Signs of liver problems: dark urine, pale bowel movements, bad stomach pain, feeling very tired and weak, unusual itching, or yellowing of the eyes or skin.  . Signs of infusion reaction: fever or shaking chills, flushing, facial swelling, feeling dizzy, headache, trouble breathing, rash, itching, chest tightness, or chest pain.  . If you think you may be pregnant  Reproduction Warnings  . Pregnancy warning: This drug can have harmful effects on the unborn baby. Women of child bearing potential should use effective methods of birth control during your cancer treatment and for at least 5 months after treatment. Let your doctor know right away if you think you may be pregnant.  . Breastfeeding warning: It is not known if this drug passes into breast milk. For this reason, women should not breast feed during treatment because this drug could enter the breast milk and cause harm to a breast feeding baby.  . Fertility warning: Human fertility studies have not been done with this drug. Talk with your doctor or nurse if you plan to have children. Ask for information on sperm or egg banking.   SELF CARE ACTIVITIES WHILE ON CHEMOTHERAPY:  Hydration Increase your fluid intake 48 hours prior to treatment and drink at least 8 to 12 cups (64 ounces) of water/decaffeinated beverages per day after treatment. You can still have your cup of coffee or soda but these beverages do not count as part of your 8 to 12 cups that you need to drink daily. No alcohol intake.  Medications Continue taking your normal prescription medication as prescribed.  If you start any new herbal or new supplements please let us know first to make sure it is safe.  Mouth Care Have teeth cleaned  professionally before starting treatment. Keep dentures and partial plates clean. Use soft toothbrush  and do not use mouthwashes that contain alcohol. Biotene is a good mouthwash that is available at most pharmacies or may be ordered by calling [phone]. Use warm salt water gargles (1 teaspoon salt per 1 quart warm water) before and after meals and at bedtime. Or you may rinse with 2 tablespoons of three-percent hydrogen peroxide mixed in eight ounces of water. If you are still having problems with your mouth or sores in your mouth please call the clinic. If you need dental work, please let the doctor know before you go for your appointment so that we can coordinate the best possible time for you in regards to your chemo regimen. You need to also let your dentist know that you are actively taking chemo. We may need to do labs prior to your dental appointment.  Skin Care Always use sunscreen that has not expired and with SPF (Sun Protection Factor) of 50 or higher. Wear hats to protect your head from the sun. Remember to use sunscreen on your hands, ears, face, & feet.  Use good moisturizing lotions such as udder cream, eucerin, or even Vaseline. Some chemotherapies can cause dry skin, color changes in your skin and nails.    . Avoid long, hot showers or baths. . Use gentle, fragrance-free soaps and laundry detergent. . Use moisturizers, preferably creams or ointments rather than lotions because the thicker consistency is better at preventing skin dehydration. Apply the cream or ointment within 15 minutes of showering. Reapply moisturizer at night, and moisturize your hands every time after you wash them.  Hair Loss (if your doctor says your hair will fall out)  . If your doctor says that your hair is likely to fall out, decide before you begin chemo whether you want to wear a wig. You may want to shop before treatment to match your hair color. . Hats, turbans, and scarves can also camouflage hair  loss, although some people prefer to leave their heads uncovered. If you go bare-headed outdoors, be sure to use sunscreen on your scalp. . Cut your hair short. It eases the inconvenience of shedding lots of hair, but it also can reduce the emotional impact of watching your hair fall out. . Don't perm or color your hair during chemotherapy. Those chemical treatments are already damaging to hair and can enhance hair loss. Once your chemo treatments are done and your hair has grown back, it's OK to resume dyeing or perming hair.  With chemotherapy, hair loss is almost always temporary. But when it grows back, it may be a different color or texture. In older adults who still had hair color before chemotherapy, the new growth may be completely gray.  Often, new hair is very fine and soft.  Infection Prevention Please wash your hands for at least 30 seconds using warm soapy water. Handwashing is the #1 way to prevent the spread of germs. Stay away from sick people or people who are getting over a cold. If you develop respiratory systems such as green/yellow mucus production or productive cough or persistent cough let us know and we will see if you need an antibiotic. It is a good idea to keep a pair of gloves on when going into grocery stores/[employer] to decrease your risk of coming into contact with germs on the carts, etc. Carry alcohol hand gel with you at all times and use it frequently if out in public. If your temperature reaches 100.5 or higher please call the clinic and let us know.  If it is after hours or on the weekend please go to the ER if your temperature is over 100.5.  Please have your own personal thermometer at home to use.    Sex and bodily fluids If you are going to have sex, a condom must be used to protect the person that isn't taking chemotherapy. Chemo can decrease your libido (sex drive). For a few days after chemotherapy, chemotherapy can be excreted through your bodily fluids.  When  using the toilet please close the lid and flush the toilet twice.  Do this for a few day after you have had chemotherapy.   Effects of chemotherapy on your sex life Some changes are simple and won't last long. They won't affect your sex life permanently.  Sometimes you may feel: . too tired . not strong enough to be very active . sick or sore  . not in the mood . anxious or low Your anxiety might not seem related to sex. For example, you may be worried about the cancer and how your treatment is going. Or you may be worried about money, or about how you family are coping with your illness. These things can cause stress, which can affect your interest in sex. It's important to talk to your partner about how you feel. Remember - the changes to your sex life don't usually last long. There's usually no medical reason to stop having sex during chemo. The drugs won't have any long term physical effects on your performance or enjoyment of sex. Cancer can't be passed on to your partner during sex  Contraception It's important to use reliable contraception during treatment. Avoid getting pregnant while you or your partner are having chemotherapy. This is because the drugs may harm the baby. Sometimes chemotherapy drugs can leave a man or woman infertile.  This means you would not be able to have children in the future. You might want to talk to someone about permanent infertility. It can be very difficult to learn that you may no longer be able to have children. Some people find counselling helpful. There might be ways to preserve your fertility, although this is easier for men than for women. You may want to speak to a fertility expert. You can talk about sperm banking or harvesting your eggs. You can also ask about other fertility options, such as donor eggs. If you have or have had breast cancer, your doctor might advise you not to take the contraceptive pill. This is because the hormones in it might  affect the cancer.  It is not known for sure whether or not chemotherapy drugs can be passed on through semen or secretions from the vagina. Because of this some doctors advise people to use a barrier method if you have sex during treatment. This applies to vaginal, anal or oral sex. Generally, doctors advise a barrier method only for the time you are actually having the treatment and for about a week after your treatment. Advice like this can be worrying, but this does not mean that you have to avoid being intimate with your partner. You can still have close contact with your partner and continue to enjoy sex.  Animals If you have cats or birds we just ask that you not change the litter or change the cage.  Please have someone else do this for you while you are on chemotherapy.   Food Safety During and After Cancer Treatment Food safety is important for people both during and after cancer  treatment. Cancer and cancer treatments, such as chemotherapy, radiation therapy, and stem cell/bone marrow transplantation, often weaken the immune system. This makes it harder for your body to protect itself from foodborne illness, also called food poisoning. Foodborne illness is caused by eating food that contains harmful bacteria, parasites, or viruses.  Foods to avoid Some foods have a higher risk of becoming tainted with bacteria. These include: Marland Kitchen Unwashed fresh fruit and vegetables, especially leafy vegetables that can hide dirt and other contaminants . Raw sprouts, such as alfalfa sprouts . Raw or undercooked beef, especially ground beef, or other raw or undercooked meat and poultry . Fatty, fried, or spicy foods immediately before or after treatment.  These can sit heavy on your stomach and make you feel nauseous. . Raw or undercooked shellfish, such as oysters. . Sushi and sashimi, which often contain raw fish.  . Unpasteurized beverages, such as unpasteurized fruit juices, raw milk, raw yogurt, or  cider . Undercooked eggs, such as soft boiled, over easy, and poached; raw, unpasteurized eggs; or foods made with raw egg, such as homemade raw cookie dough and homemade mayonnaise  Simple steps for food safety  Shop smart. . Do not buy food stored or displayed in an unclean area. . Do not buy bruised or damaged fruits or vegetables. . Do not buy cans that have cracks, dents, or bulges. . Pick up foods that can spoil at the end of your shopping trip and store them in a cooler on the way home.  Prepare and clean up foods carefully. . Rinse all fresh fruits and vegetables under running water, and dry them with a clean towel or paper towel. . Clean the top of cans before opening them. . After preparing food, wash your hands for 20 seconds with hot water and soap. Pay special attention to areas between fingers and under nails. . Clean your utensils and dishes with hot water and soap. Marland Kitchen Disinfect your kitchen and cutting boards using 1 teaspoon of liquid, unscented bleach mixed into 1 quart of water.    Dispose of old food. . Eat canned and packaged food before its expiration date (the "use by" or "best before" date). . Consume refrigerated leftovers within 3 to 4 days. After that time, throw out the food. Even if the food does not smell or look spoiled, it still may be unsafe. Some bacteria, such as Listeria, can grow even on foods stored in the refrigerator if they are kept for too long.  Take precautions when eating out. . At restaurants, avoid buffets and salad bars where food sits out for a long time and comes in contact with many people. Food can become contaminated when someone with a virus, often a norovirus, or another "bug" handles it. . Put any leftover food in a "to-go" container yourself, rather than having the server do it. And, refrigerate leftovers as soon as you get home. . Choose restaurants that are clean and that are willing to prepare your food as you order it  cooked.    SYMPTOMS TO REPORT AS SOON AS POSSIBLE AFTER TREATMENT:   FEVER GREATER THAN 100.5 F  CHILLS WITH OR WITHOUT FEVER  NAUSEA AND VOMITING THAT IS NOT CONTROLLED WITH YOUR NAUSEA MEDICATION  UNUSUAL SHORTNESS OF BREATH  UNUSUAL BRUISING OR BLEEDING  TENDERNESS IN MOUTH AND THROAT WITH OR WITHOUT PRESENCE OF ULCERS  URINARY PROBLEMS  BOWEL PROBLEMS  UNUSUAL RASH  UNUSUAL HEADACHES, DIZZINESS, AND/OR BLURRED VISION  MUSCLE WEAKNESS IN ARMS OR  LEGS  NUMBNESS OR TINGLING IN HANDS OR FEET THAT IS NEW      Wear comfortable clothing and clothing appropriate for easy access to any Portacath or PICC line. Let us know if there is anything that we can do to make your therapy better!    What to do if you need assistance after hours or on the weekends: CALL [phone].  HOLD on the line, do not hang up.  You will hear multiple messages but at the end you will be connected with a nurse triage line.  They will contact the doctor if necessary.  Most of the time they will be able to assist you.  Do not call the hospital operator.      I have been informed and understand all of the instructions given to me and have received a copy. I have been instructed to call the clinic [phone] or my family physician as soon as possible for continued medical care, if indicated. I do not have any more questions at this time but understand that I may call the [place] or the Patient Navigator at [phone] during office hours should I have questions or need assistance in obtaining follow-up care.

## 2018-12-27 NOTE — H&P (Signed)
Mark Huang; [id number]; 30-Nov-1950   HPI Patient is a 68 year old white male who was referred to my care by oncology and Mark Huang for Port-A-Cath placement.  He has a right chest wall melanoma and is about to undergo immunotherapy for treatment.  He recently had right axillary lymph node dissection and right chest wall melanoma excision at [facility].  He is still being followed by them for the postoperative care.  He has 4 out of 10 right arm and incisional pain. Past Medical History:  Diagnosis Date  . BPH (benign prostatic hyperplasia)   . COPD (chronic obstructive pulmonary disease) ([place])   . Diabetes mellitus without complication ([place])   . Hypertension   . Seasonal allergies   . Sleep apnea with use of continuous positive airway pressure (CPAP)    uses CPAP at night     Past Surgical History:  Procedure Laterality Date  . APPENDECTOMY    . CRANIOTOMY Left 12/30/2015   Procedure: BURR HOLE HEMATOMA EVACUATION SUBDURAL;  Surgeon: Kary Kos, MD;  Location: [facility];  Service: Neurosurgery;  Laterality: Left;  . TONSILLECTOMY      History reviewed. No pertinent family history.  Current Outpatient Medications on File Prior to Visit  Medication Sig Dispense Refill  . ibuprofen (ADVIL,MOTRIN) 800 MG tablet Take 800 mg by mouth every 8 (eight) hours as needed.    Marland Kitchen amLODipine (NORVASC) 10 MG tablet     . atorvastatin (LIPITOR) 20 MG tablet Take 20 mg by mouth daily.    . cetirizine (ZYRTEC) 10 MG tablet Take 10 mg by mouth daily as needed for allergies.     . Dapagliflozin-metFORMIN HCl ER (XIGDUO XR) 03-999 MG TB24 Take by mouth.    . Empagliflozin-metFORMIN HCl ER (SYNJARDY XR) 12.03-999 MG TB24 Take by mouth.    . fluticasone (FLONASE) 50 MCG/ACT nasal spray Place 1 spray into both nostrils daily.    Marland Kitchen HYDROcodone-acetaminophen (NORCO/VICODIN) 5-325 MG tablet     . insulin glargine (LANTUS) 100 UNIT/ML injection Inject 25-35 Units into the skin daily.     .  irbesartan-hydrochlorothiazide (AVALIDE) 300-12.5 MG tablet Take 1 tablet by mouth daily.    Marland Kitchen LANTUS SOLOSTAR 100 UNIT/ML Solostar Pen     . tamsulosin (FLOMAX) 0.4 MG CAPS capsule Take 0.4 mg by mouth at bedtime.      No current facility-administered medications on file prior to visit.     Allergies  Allergen Reactions  . Penicillins     Has patient had a PCN reaction causing immediate rash, facial/tongue/throat swelling, SOB or lightheadedness with hypotension: unknown Has patient had a PCN reaction causing severe rash involving mucus membranes or skin necrosis: unknown Has patient had a PCN reaction that required hospitalization: unknown Has patient had a PCN reaction occurring within the last 10 years: No If all of the above answers are "NO", then may proceed with  . Sulfa Antibiotics   . Oxycodone Nausea And Vomiting    Social History   Substance and Sexual Activity  Alcohol Use No    Social History   Tobacco Use  Smoking Status Never Smoker  Smokeless Tobacco Never Used    Review of Systems  Constitutional: Negative.   HENT: Negative.   Eyes: Negative.   Respiratory: Negative.   Cardiovascular: Negative.   Gastrointestinal: Negative.   Genitourinary: Positive for frequency.  Musculoskeletal: Negative.   Skin: Negative.   Neurological: Negative.   Endo/Heme/Allergies: Negative.   Psychiatric/Behavioral: Negative.  Objective   Vitals:   12/27/18 1112  BP: (!) 153/70  Pulse: (!) 59  Resp: 20  Temp: 98.4 F (36.9 C)    Physical Exam Vitals signs reviewed.  Constitutional:      Appearance: Normal appearance. He is not ill-appearing.  HENT:     Head: Normocephalic and atraumatic.  Cardiovascular:     Rate and Rhythm: Normal rate and regular rhythm.     Heart sounds: Normal heart sounds. No murmur. No friction rub. No gallop.   Pulmonary:     Effort: Pulmonary effort is normal. No respiratory distress.     Breath sounds: Normal breath sounds. No  stridor. No wheezing, rhonchi or rales.  Skin:    General: Skin is warm and dry.  Neurological:     Mental Status: He is alert and oriented to person, place, and time.    Oncology notes reviewed. Assessment  Melanoma, need for central venous access Plan   Scheduled for Portacath insertion on 01/09/19.  The risks and benefits of procedure including bleeding infection, and pneumothorax were fully explained to the patient, who gives informed consent.

## 2018-12-27 NOTE — Patient Instructions (Signed)

## 2018-12-28 ENCOUNTER — Encounter (HOSPITAL_COMMUNITY): Payer: Self-pay | Admitting: *Deleted

## 2018-12-28 DIAGNOSIS — C439 Malignant melanoma of skin, unspecified: Secondary | ICD-10-CM | POA: Diagnosis not present

## 2018-12-28 DIAGNOSIS — Z09 Encounter for follow-up examination after completed treatment for conditions other than malignant neoplasm: Secondary | ICD-10-CM | POA: Diagnosis not present

## 2018-12-28 NOTE — Progress Notes (Signed)
I spoke with Cristie Hem, NP at [facility] today and she saw patient in clinic this morning. She states that he still has the drain in place and it is putting out quite a bit of fluid so she feels that it will not be out anytime soon.  She states that they also want him to see their medical oncologist for a second opinion and then they will collaborate with Dr. Walden Field regarding his treatment for melanoma moving forward.  They want to cancel all of our appointments and they will see him back in 2 weeks for a check up and see where he is at at that time.  He also has an appointment with them on 3/19.    I have cancelled all our appointments here.  He will come back to see Dr. Walden Field after the 19th appointment there at [facility].   I asked if she could forward their office notes to our office and Cristie Hem, NP said for our physician to follow her notes in Care Everywhere.

## 2019-01-01 ENCOUNTER — Ambulatory Visit (HOSPITAL_COMMUNITY): Payer: Medicare PPO

## 2019-01-04 ENCOUNTER — Inpatient Hospital Stay (HOSPITAL_COMMUNITY): Admission: RE | Admit: 2019-01-04 | Payer: Medicare PPO | Source: Ambulatory Visit

## 2019-01-08 ENCOUNTER — Inpatient Hospital Stay (HOSPITAL_COMMUNITY): Payer: Medicare PPO

## 2019-01-08 ENCOUNTER — Other Ambulatory Visit (HOSPITAL_COMMUNITY): Payer: Medicare PPO

## 2019-01-09 ENCOUNTER — Ambulatory Visit (HOSPITAL_COMMUNITY): Payer: Medicare PPO | Admitting: Internal Medicine

## 2019-01-09 ENCOUNTER — Encounter (HOSPITAL_COMMUNITY): Payer: Self-pay

## 2019-01-09 ENCOUNTER — Ambulatory Visit (HOSPITAL_COMMUNITY): Admit: 2019-01-09 | Payer: Medicare PPO | Admitting: General Surgery

## 2019-01-09 ENCOUNTER — Ambulatory Visit (HOSPITAL_COMMUNITY): Payer: Medicare PPO

## 2019-01-09 DIAGNOSIS — C4359 Malignant melanoma of other part of trunk: Secondary | ICD-10-CM | POA: Diagnosis not present

## 2019-01-09 SURGERY — INSERTION, TUNNELED CENTRAL VENOUS DEVICE, WITH PORT
Anesthesia: Monitor Anesthesia Care | Laterality: Left

## 2019-01-14 DIAGNOSIS — E782 Mixed hyperlipidemia: Secondary | ICD-10-CM | POA: Diagnosis not present

## 2019-01-14 DIAGNOSIS — E1165 Type 2 diabetes mellitus with hyperglycemia: Secondary | ICD-10-CM | POA: Diagnosis not present

## 2019-01-14 DIAGNOSIS — Z713 Dietary counseling and surveillance: Secondary | ICD-10-CM | POA: Diagnosis not present

## 2019-01-14 DIAGNOSIS — I10 Essential (primary) hypertension: Secondary | ICD-10-CM | POA: Diagnosis not present

## 2019-01-14 DIAGNOSIS — Z125 Encounter for screening for malignant neoplasm of prostate: Secondary | ICD-10-CM | POA: Diagnosis not present

## 2019-01-14 DIAGNOSIS — E119 Type 2 diabetes mellitus without complications: Secondary | ICD-10-CM | POA: Diagnosis not present

## 2019-01-15 ENCOUNTER — Encounter: Payer: Self-pay | Admitting: Family Medicine

## 2019-01-15 DIAGNOSIS — C4359 Malignant melanoma of other part of trunk: Secondary | ICD-10-CM | POA: Diagnosis not present

## 2019-01-16 ENCOUNTER — Telehealth: Payer: Self-pay | Admitting: *Deleted

## 2019-01-16 NOTE — Progress Notes (Addendum)
[facility]  PATIENT: Mark Huang DOB: [birth year]   REASON FOR VISIT: Follow-up for obstructive sleep apnea here for CPAP initial compliance due to new machine HISTORY FROM: Patient    HISTORY OF PRESENT ILLNESS:UPDATE 3/5/2020CM Mark Huang, 68 year old male returns for follow-up with history of obstructive sleep apnea.  Repeat sleep study July 17, 2018 shows severe obstructive sleep apnea.  He needs a new machine.  He returns for reevaluation today without any problems he does have a minor leak.  He has a beard.  CPAP data dated 12/17/2018-01/15/2019 shows compliance greater than 4 hours at 100%.  Average usage 7 hours 45 minutes.  Set pressure 14 cm leaks 95th percentile 54.6.  AHI 3.7 ESS 4.  He returns for reevaluation 8/12/19SAMr. Huang is a 68 year old right-handed gentleman with an underlying medical history of hypertension, hyperlipidemia, suboptimally controlled diabetes, COPD, BPH, seasonal allergies, history of subdural hematoma with status post surgery in 2017, and obesity, who was previously diagnosed with obstructive sleep apnea and placed on CPAP therapy. Sleep study testing was over 10 years ago. Prior sleep study results are not available for my review today. A CPAP download was reviewed today from 05/26/2018 through 06/24/2018, which is a total of 30 days, during which time he used his machine every night with percent used days greater than 4 hours at 100%, indicating superb compliance with an average usage of 7 hours and 57 minutes, residual AHI information is not available, pressure high at 20 cm with EPR of 3. I reviewed your office note from 02/15/2018, which you kindly included. He had blood work in your office in April which I reviewed: CBC with differential showed elevated white cell count at 12.2 with absolute lymphocyte count elevated at 6.8. CMP showed sodium level of 146, otherwise normal findings. Lipid profile showed triglycerides of 200, otherwise  unremarkable findings. A1c was 7 on 02/13/2018. He is wondering if his pressure needs to be changed. His machine is also older, [employer]. He reports difficulty with sleep, he goes to sleep okay but does not stay asleep well. He estimates that he only gets about 3-4 hours of sleep on an average night, has nocturia at least 3 times per average night and also some urinary incontinence overnight. Which he has to wear depends. He does endorse restless leg symptoms and discomfort in his feet including tingling at night, does not like for the sheets to touch his feet. He sleeps with 4 different pillows and different places to accommodate for discomfort at night. His wife is not able to sleep in the same bed with him. He tosses and turns. Bedtime is generally between 8:30 and 9:30, rise time around 5:30 AM. He has been using a [employer] full face mask, size small, could not tolerate a chinstrap in the past. Overall, with time he has been able to lose weight. His original sleep study was probably 25 years ago he estimates. This is his second CPAP machine which is about 69 years old he estimates. His brother had sleep apnea as well, sadly, brother died young around age 100 from pancreatic cancer.  His Epworth sleepiness score is 15 out of 24, fatigue score is 46 out of 63. He does not sleep very well. He is a nonsmoker and does not utilize alcohol, drinks caffeine in the form of coffee, 2 cups per day on average. He works at [employer]. He lives with his wife, they have 2 grown children.  REVIEW OF SYSTEMS: Full  14 system review of systems performed and notable only for those listed, all others are neg:  Constitutional: neg  Cardiovascular: neg Ear/Nose/Throat: neg  Skin: neg Eyes: neg Respiratory: neg Gastroitestinal: neg  Hematology/Lymphatic: neg  Endocrine: neg Musculoskeletal:neg Allergy/Immunology: neg Neurological: neg Psychiatric: neg Sleep : ne obstructive sleep apnea with CPAP     ALLERGIES: Allergies  Allergen Reactions  . Penicillins     Has patient had a PCN reaction causing immediate rash, facial/tongue/throat swelling, SOB or lightheadedness with hypotension: unknown Has patient had a PCN reaction causing severe rash involving mucus membranes or skin necrosis: unknown Has patient had a PCN reaction that required hospitalization: unknown Has patient had a PCN reaction occurring within the last 10 years: No If all of the above answers are "NO", then may proceed with  . Sulfa Antibiotics   . Oxycodone Nausea And Vomiting    HOME MEDICATIONS: Outpatient Medications Prior to Visit  Medication Sig Dispense Refill  . amLODipine (NORVASC) 10 MG tablet Take 10 mg by mouth daily.     Marland Kitchen atorvastatin (LIPITOR) 20 MG tablet Take 20 mg by mouth daily.    . cetirizine (ZYRTEC) 10 MG tablet Take 10 mg by mouth daily as needed for allergies.     . Empagliflozin-metFORMIN HCl ER (SYNJARDY XR) 12.03-999 MG TB24 Take 1 tablet by mouth every morning.     . fluticasone (FLONASE) 50 MCG/ACT nasal spray Place 2 sprays into both nostrils daily.     . insulin glargine (LANTUS) 100 UNIT/ML injection Inject 25-30 Units into the skin every morning.     . irbesartan-hydrochlorothiazide (AVALIDE) 300-12.5 MG tablet Take 1 tablet by mouth daily.    Marland Kitchen OVER THE COUNTER MEDICATION Take 3 capsules by mouth daily. Glucose Stabili-T    . polyethylene glycol (MIRALAX / GLYCOLAX) packet Take 17 g by mouth daily as needed.    . tamsulosin (FLOMAX) 0.4 MG CAPS capsule Take 0.4 mg by mouth at bedtime.     Marland Kitchen ibuprofen (ADVIL,MOTRIN) 800 MG tablet Take 800 mg by mouth at bedtime.     Marland Kitchen HYDROcodone-acetaminophen (NORCO/VICODIN) 5-325 MG tablet Take 1 tablet by mouth at bedtime.      No facility-administered medications prior to visit.     PAST MEDICAL HISTORY: Past Medical History:  Diagnosis Date  . BPH (benign prostatic hyperplasia)   . COPD (chronic obstructive pulmonary disease) ([place])    . Diabetes mellitus without complication ([place])   . Hypertension   . Seasonal allergies   . Sleep apnea with use of continuous positive airway pressure (CPAP)    uses CPAP at night     PAST SURGICAL HISTORY: Past Surgical History:  Procedure Laterality Date  . APPENDECTOMY    . CRANIOTOMY Left 12/30/2015   Procedure: BURR HOLE HEMATOMA EVACUATION SUBDURAL;  Surgeon: Kary Kos, MD;  Location: [facility];  Service: Neurosurgery;  Laterality: Left;  . TONSILLECTOMY      FAMILY HISTORY: History reviewed. No pertinent family history.  SOCIAL HISTORY: Social History   Socioeconomic History  . Marital status: Married    Spouse name: Not on file  . Number of children: Not on file  . Years of education: Not on file  . Highest education level: Not on file  Occupational History  . Not on file  Social Needs  . Financial resource strain: Not on file  . Food insecurity:    Worry: Not on file    Inability: Not on file  . Transportation needs:  Medical: Not on file    Non-medical: Not on file  Tobacco Use  . Smoking status: Never Smoker  . Smokeless tobacco: Never Used  Substance and Sexual Activity  . Alcohol use: No  . Drug use: No  . Sexual activity: Not on file  Lifestyle  . Physical activity:    Days per week: Not on file    Minutes per session: Not on file  . Stress: Not on file  Relationships  . Social connections:    Talks on phone: Not on file    Gets together: Not on file    Attends religious service: Not on file    Active member of club or organization: Not on file    Attends meetings of clubs or organizations: Not on file    Relationship status: Not on file  . Intimate partner violence:    Fear of current or ex partner: Not on file    Emotionally abused: Not on file    Physically abused: Not on file    Forced sexual activity: Not on file  Other Topics Concern  . Not on file  Social History Narrative  . Not on file     PHYSICAL EXAM  Vitals:    01/17/19 1437  BP: (!) 148/67  Pulse: (!) 59  Weight: 203 lb (92.1 kg)  Height: 5\' 5"  (1.651 m)   Body mass index is 33.78 kg/m.  Generalized: Well developed, obese male in no acute distress  Head: normocephalic and atraumatic,. Oropharynx benign  Neck: Supple, Musculoskeletal: No deformity   Neurological examination   Mentation: Alert oriented to time, place, history taking. Attention span and concentration appropriate. Recent and remote memory intact.  Follows all commands speech and language fluent.   Cranial nerve II-XII: Pupils are equal and reactive to light , extraocular movements were full, visual field were full on confrontational test. Facial sensation and strength were normal. hearing was intact to finger rubbing bilaterally. Uvula tongue midline. head turning and shoulder shrug were normal and symmetric.Tongue protrusion into cheek strength was normal. Motor: normal bulk and tone, full strength in the BUE, BLE, Sensory: normal and symmetric to light touch,  Coordination: finger-nose-finger, heel-to-shin bilaterally, no dysmetria Gait and Station: Rising up from seated position without assistance, normal stance,  moderate stride, good arm swing, smooth turning, able to perform tiptoe, and heel walking without difficulty. Tandem gait is steady  DIAGNOSTIC DATA (LABS, IMAGING, TESTING) - I reviewed patient records, labs, notes, testing and imaging myself where available.  Lab Results  Component Value Date   WBC 19.3 (H) 12/26/2018   HGB 13.3 12/26/2018   HCT 43.2 12/26/2018   MCV 90.4 12/26/2018   PLT 233 12/26/2018      Component Value Date/Time   NA 142 12/26/2018 1140   K 3.5 12/26/2018 1140   CL 105 12/26/2018 1140   CO2 26 12/26/2018 1140   GLUCOSE 147 (H) 12/26/2018 1140   BUN 15 12/26/2018 1140   CREATININE 1.01 12/26/2018 1140   CALCIUM 8.9 12/26/2018 1140   PROT 7.3 12/26/2018 1140   ALBUMIN 4.3 12/26/2018 1140   AST 19 12/26/2018 1140   ALT 18  12/26/2018 1140   ALKPHOS 71 12/26/2018 1140   BILITOT 0.7 12/26/2018 1140   GFRNONAA >60 12/26/2018 1140   GFRAA >60 12/26/2018 1140    ASSESSMENT AND PLAN   Mark Huang is a very pleasant 68 y.o.-year old male with a history and physical exam concerning for obstructive sleep apnea (OSA).  Repeat sleep study 07/17/2018 shows severe obstructive sleep apnea.  He is here for CPAP compliance with new machine.CPAP data dated 12/17/2018-01/15/2019 shows compliance greater than 4 hours at 100%.  Average usage 7 hours 45 minutes.  Set pressure 14 cm leaks 95th percentile 54.6.  AHI 3.7 ESS 4.    PLAN: CPAP compliance 100% reviewed data with patient Has mask leak needs mask refit Follow-up in 4 months I spent 20 minutes in total face to face time with the patient more than 50% of which was spent counseling and coordination of care, reviewing sleep test results reviewing medications and discussing and reviewing the diagnosis of obstructive sleep apnea and treatment options. , Dennie Bible, [facility], [facility] [place], APRN  [facility] [address], [place], [place] [address] [phone] I reviewed the above note and documentation by the Nurse Practitioner and agree with the history, physical exam, assessment and plan as outlined above. I was immediately available for face-to-face consultation. Star Age, MD, PhD [facility])

## 2019-01-16 NOTE — Telephone Encounter (Signed)
Spoke to pt and he relayed having issues with machine, has been in touch with Derrill Kay RT [phone].  Issue resolved.  Has new machine and contacted [employer], and she stated did change in system to be able to get wireless report.  Attempted and still not able to get via airview or [place].  Called back and LM, she will return call.

## 2019-01-17 ENCOUNTER — Encounter: Payer: Self-pay | Admitting: Nurse Practitioner

## 2019-01-17 ENCOUNTER — Ambulatory Visit: Payer: Medicare PPO | Admitting: Nurse Practitioner

## 2019-01-17 DIAGNOSIS — G4733 Obstructive sleep apnea (adult) (pediatric): Secondary | ICD-10-CM | POA: Diagnosis not present

## 2019-01-17 DIAGNOSIS — Z9989 Dependence on other enabling machines and devices: Secondary | ICD-10-CM

## 2019-01-17 NOTE — Progress Notes (Signed)
[place], Jerilee Hoh, RN; Eduardo Osier D with [employer]        I have pulled this order and will send over to our [place], [place] center.

## 2019-01-17 NOTE — Patient Instructions (Addendum)
CPAP compliance 100% Has mask leak needs mask refit Follow-up in 4 months

## 2019-01-23 ENCOUNTER — Other Ambulatory Visit (HOSPITAL_COMMUNITY): Payer: Medicare PPO

## 2019-01-23 ENCOUNTER — Ambulatory Visit (HOSPITAL_COMMUNITY): Payer: Medicare PPO

## 2019-01-23 ENCOUNTER — Ambulatory Visit (HOSPITAL_COMMUNITY): Payer: Medicare PPO | Admitting: Internal Medicine

## 2019-01-24 DIAGNOSIS — R972 Elevated prostate specific antigen [PSA]: Secondary | ICD-10-CM | POA: Diagnosis not present

## 2019-01-24 DIAGNOSIS — I10 Essential (primary) hypertension: Secondary | ICD-10-CM | POA: Diagnosis not present

## 2019-01-24 DIAGNOSIS — G4733 Obstructive sleep apnea (adult) (pediatric): Secondary | ICD-10-CM | POA: Diagnosis not present

## 2019-01-24 DIAGNOSIS — C4359 Malignant melanoma of other part of trunk: Secondary | ICD-10-CM | POA: Diagnosis not present

## 2019-01-24 DIAGNOSIS — E1165 Type 2 diabetes mellitus with hyperglycemia: Secondary | ICD-10-CM | POA: Diagnosis not present

## 2019-01-24 DIAGNOSIS — N400 Enlarged prostate without lower urinary tract symptoms: Secondary | ICD-10-CM | POA: Diagnosis not present

## 2019-01-24 DIAGNOSIS — D72829 Elevated white blood cell count, unspecified: Secondary | ICD-10-CM | POA: Diagnosis not present

## 2019-01-24 DIAGNOSIS — C9110 Chronic lymphocytic leukemia of B-cell type not having achieved remission: Secondary | ICD-10-CM | POA: Diagnosis not present

## 2019-01-24 DIAGNOSIS — M545 Low back pain: Secondary | ICD-10-CM | POA: Diagnosis not present

## 2019-01-25 DIAGNOSIS — G4733 Obstructive sleep apnea (adult) (pediatric): Secondary | ICD-10-CM | POA: Diagnosis not present

## 2019-01-31 DIAGNOSIS — Z8679 Personal history of other diseases of the circulatory system: Secondary | ICD-10-CM | POA: Diagnosis not present

## 2019-01-31 DIAGNOSIS — C4359 Malignant melanoma of other part of trunk: Secondary | ICD-10-CM | POA: Diagnosis not present

## 2019-01-31 DIAGNOSIS — R918 Other nonspecific abnormal finding of lung field: Secondary | ICD-10-CM | POA: Diagnosis not present

## 2019-01-31 DIAGNOSIS — C9110 Chronic lymphocytic leukemia of B-cell type not having achieved remission: Secondary | ICD-10-CM | POA: Diagnosis not present

## 2019-01-31 DIAGNOSIS — Z79899 Other long term (current) drug therapy: Secondary | ICD-10-CM | POA: Diagnosis not present

## 2019-01-31 DIAGNOSIS — C439 Malignant melanoma of skin, unspecified: Secondary | ICD-10-CM | POA: Diagnosis not present

## 2019-02-04 ENCOUNTER — Other Ambulatory Visit (HOSPITAL_COMMUNITY): Payer: Self-pay | Admitting: Hematology

## 2019-02-04 DIAGNOSIS — C439 Malignant melanoma of skin, unspecified: Secondary | ICD-10-CM

## 2019-02-05 ENCOUNTER — Ambulatory Visit (HOSPITAL_COMMUNITY): Payer: Medicare PPO | Admitting: Hematology

## 2019-02-25 DIAGNOSIS — I10 Essential (primary) hypertension: Secondary | ICD-10-CM | POA: Diagnosis not present

## 2019-02-25 DIAGNOSIS — R972 Elevated prostate specific antigen [PSA]: Secondary | ICD-10-CM | POA: Diagnosis not present

## 2019-02-25 DIAGNOSIS — E782 Mixed hyperlipidemia: Secondary | ICD-10-CM | POA: Diagnosis not present

## 2019-02-25 DIAGNOSIS — N400 Enlarged prostate without lower urinary tract symptoms: Secondary | ICD-10-CM | POA: Diagnosis not present

## 2019-02-25 DIAGNOSIS — D72829 Elevated white blood cell count, unspecified: Secondary | ICD-10-CM | POA: Diagnosis not present

## 2019-02-25 DIAGNOSIS — C9110 Chronic lymphocytic leukemia of B-cell type not having achieved remission: Secondary | ICD-10-CM | POA: Diagnosis not present

## 2019-02-25 DIAGNOSIS — E1165 Type 2 diabetes mellitus with hyperglycemia: Secondary | ICD-10-CM | POA: Diagnosis not present

## 2019-02-25 DIAGNOSIS — G4733 Obstructive sleep apnea (adult) (pediatric): Secondary | ICD-10-CM | POA: Diagnosis not present

## 2019-03-11 ENCOUNTER — Other Ambulatory Visit: Payer: Self-pay

## 2019-03-11 ENCOUNTER — Ambulatory Visit (HOSPITAL_COMMUNITY)
Admission: RE | Admit: 2019-03-11 | Discharge: 2019-03-11 | Disposition: A | Discharge status: Home / Self Care | Payer: Medicare PPO | Source: Ambulatory Visit | Attending: Hematology | Admitting: Hematology

## 2019-03-11 DIAGNOSIS — R591 Generalized enlarged lymph nodes: Secondary | ICD-10-CM | POA: Diagnosis not present

## 2019-03-11 DIAGNOSIS — N400 Enlarged prostate without lower urinary tract symptoms: Secondary | ICD-10-CM | POA: Diagnosis not present

## 2019-03-11 DIAGNOSIS — K5730 Diverticulosis of large intestine without perforation or abscess without bleeding: Secondary | ICD-10-CM | POA: Diagnosis not present

## 2019-03-11 DIAGNOSIS — C439 Malignant melanoma of skin, unspecified: Secondary | ICD-10-CM | POA: Diagnosis not present

## 2019-03-11 LAB — POCT I-STAT CREATININE: Creatinine, Ser: 0.9 mg/dL (ref 0.61–1.24)

## 2019-03-11 MED ORDER — IOHEXOL 300 MG/ML  SOLN
100.0000 mL | Freq: Once | INTRAMUSCULAR | Status: AC | PRN
  Administered 2019-03-11: 100 mL via INTRAVENOUS

## 2019-03-12 ENCOUNTER — Other Ambulatory Visit: Payer: Self-pay

## 2019-03-13 ENCOUNTER — Inpatient Hospital Stay (HOSPITAL_COMMUNITY): Payer: Medicare PPO | Attending: Internal Medicine | Admitting: Hematology

## 2019-03-13 ENCOUNTER — Encounter (HOSPITAL_COMMUNITY): Payer: Self-pay | Admitting: Hematology

## 2019-03-13 VITALS — BP 155/70 | HR 61 | Temp 98.2°F | Resp 20 | Wt 210.7 lb

## 2019-03-13 DIAGNOSIS — I10 Essential (primary) hypertension: Secondary | ICD-10-CM | POA: Diagnosis not present

## 2019-03-13 DIAGNOSIS — C4359 Malignant melanoma of other part of trunk: Secondary | ICD-10-CM | POA: Diagnosis not present

## 2019-03-13 DIAGNOSIS — E119 Type 2 diabetes mellitus without complications: Secondary | ICD-10-CM | POA: Diagnosis not present

## 2019-03-13 DIAGNOSIS — G4730 Sleep apnea, unspecified: Secondary | ICD-10-CM | POA: Insufficient documentation

## 2019-03-13 DIAGNOSIS — C9110 Chronic lymphocytic leukemia of B-cell type not having achieved remission: Secondary | ICD-10-CM | POA: Insufficient documentation

## 2019-03-13 DIAGNOSIS — Z794 Long term (current) use of insulin: Secondary | ICD-10-CM | POA: Insufficient documentation

## 2019-03-13 DIAGNOSIS — N400 Enlarged prostate without lower urinary tract symptoms: Secondary | ICD-10-CM | POA: Diagnosis not present

## 2019-03-13 DIAGNOSIS — C434 Malignant melanoma of scalp and neck: Secondary | ICD-10-CM | POA: Diagnosis not present

## 2019-03-13 DIAGNOSIS — Z79899 Other long term (current) drug therapy: Secondary | ICD-10-CM | POA: Diagnosis not present

## 2019-03-13 DIAGNOSIS — C439 Malignant melanoma of skin, unspecified: Secondary | ICD-10-CM | POA: Insufficient documentation

## 2019-03-13 NOTE — Patient Instructions (Signed)
[facility] at [facility] Discharge Instructions  You were seen today by Dr. Katragadda. He went over your recent lab and scan results. He will see you back in 3 months for labs and follow up.   Thank you for choosing [facility] at [facility] to provide your oncology and hematology care.  To afford each patient quality time with our provider, please arrive at least 15 minutes before your scheduled appointment time.   If you have a lab appointment with the Cancer Center please come in thru the  Main Entrance and check in at the main information desk  You need to re-schedule your appointment should you arrive 10 or more minutes late.  We strive to give you quality time with our providers, and arriving late affects you and other patients whose appointments are after yours.  Also, if you no show three or more times for appointments you may be dismissed from the clinic at the providers discretion.     Again, thank you for choosing [facility].  Our hope is that these requests will decrease the amount of time that you wait before being seen by our physicians.       _____________________________________________________________  Should you have questions after your visit to [facility], please contact our office at [phone] between the hours of 8:00 a.m. and 4:30 p.m.  Voicemails left after 4:00 p.m. will not be returned until the following business day.  For prescription refill requests, have your pharmacy contact our office and allow 72 hours.    Cancer Center Support Programs:   > Cancer Support Group  2nd Tuesday of the month 1pm-2pm, Journey Room    

## 2019-03-13 NOTE — Assessment & Plan Note (Signed)
1.  Stage IIIa (pT2a N1AM0) malignant melanoma: - Resection on 10/15/2018 showing superficial spreading melanoma, 1.2 mm, uninvolved peripheral margins, deep margin uninvolved but close, mitotic index 2/millimeters SQ, pathological staging pT2a, no lymphovascular invasion, no neurotropism - Wide excision and sentinel lymph node mapping on 11/23/2018 showing 1 sentinel lymph node with microscopic melanoma 0.1 mm, no residual tumor. - Lymph node dissection on 12/14/2018 shows 28 lymph nodes negative, 9 sentinel lymph nodes negative. -Foundation 1 testing shows BRAF and KIT mutation negative.  MS-stable.  TMB-15 Muts/Mb.  NRAS Q61K positive. - We reviewed results of CT CAP from 03/11/2019.  Persistent lymphadenopathy in the upper abdomen in the portacaval and hepatoduodenal ligament nodal stations, stable from CT on 10/19/2018.  No other lymphadenopathy elsewhere in the chest or pelvis. - The lymphadenopathy in the upper abdomen was thought to be secondary to CLL which was diagnosed in November 2019. -He was evaluated by Dr. Harriet Masson at [facility] who recommended against immunotherapy. - In stage IIIa disease, risk of disease recurrence is less than 20% and therefore observation is an option.  Patient is agreeable to this.  We will do CT scans every 3 months during the first year. -I will see him back  after the CT scans in 3 months.  2.  CLL: - Diagnosed by flow cytometry on 10/01/2018, CD5 positive, CD10 negative and CD20 positive population. -He does not have any fevers, night sweats or weight loss.  LDH is normal.  He does not have any cytopenias.  He does not have any adenopathy other than in the upper abdomen. -We will continue to monitor at 6 monthly intervals.

## 2019-03-13 NOTE — Progress Notes (Signed)
[facility] [address] [place] [address]   CLINIC:  Medical Oncology/Hematology  PCP:  Celene Squibb, MD [place] [address] [phone]   REASON FOR VISIT:  Follow-up for CLL and malignant melanoma.    INTERVAL HISTORY:  Mark Huang 68 y.o. male returns for routine follow-up. He is here today alone. He states that he has been doing well since his last visit. Denies any nausea, vomiting, or diarrhea. Denies any new pains. Had not noticed any recent bleeding such as epistaxis, hematuria or hematochezia. Denies recent chest pain on exertion, shortness of breath on minimal exertion, pre-syncopal episodes, or palpitations. Denies any numbness or tingling in hands or feet. Denies any recent fevers, infections, or recent hospitalizations. Patient reports appetite at 100% and energy level at 75%.   REVIEW OF SYSTEMS:  Review of Systems  All other systems reviewed and are negative.    PAST MEDICAL/SURGICAL HISTORY:  Past Medical History:  Diagnosis Date  . BPH (benign prostatic hyperplasia)   . COPD (chronic obstructive pulmonary disease) ([place])   . Diabetes mellitus without complication ([place])   . Hypertension   . Seasonal allergies   . Sleep apnea with use of continuous positive airway pressure (CPAP)    uses CPAP at night    Past Surgical History:  Procedure Laterality Date  . APPENDECTOMY    . CRANIOTOMY Left 12/30/2015   Procedure: BURR HOLE HEMATOMA EVACUATION SUBDURAL;  Surgeon: Kary Kos, MD;  Location: [facility];  Service: Neurosurgery;  Laterality: Left;  . TONSILLECTOMY       SOCIAL HISTORY:  Social History   Socioeconomic History  . Marital status: Married    Spouse name: Not on file  . Number of children: Not on file  . Years of education: Not on file  . Highest education level: Not on file  Occupational History  . Not on file  Social Needs  . Financial resource strain: Not on file  . Food insecurity:   Worry: Not on file    Inability: Not on file  . Transportation needs:    Medical: Not on file    Non-medical: Not on file  Tobacco Use  . Smoking status: Never Smoker  . Smokeless tobacco: Never Used  Substance and Sexual Activity  . Alcohol use: No  . Drug use: No  . Sexual activity: Not on file  Lifestyle  . Physical activity:    Days per week: Not on file    Minutes per session: Not on file  . Stress: Not on file  Relationships  . Social connections:    Talks on phone: Not on file    Gets together: Not on file    Attends religious service: Not on file    Active member of club or organization: Not on file    Attends meetings of clubs or organizations: Not on file    Relationship status: Not on file  . Intimate partner violence:    Fear of current or ex partner: Not on file    Emotionally abused: Not on file    Physically abused: Not on file    Forced sexual activity: Not on file  Other Topics Concern  . Not on file  Social History Narrative  . Not on file    FAMILY HISTORY:  History reviewed. No pertinent family history.  CURRENT MEDICATIONS:  Outpatient Encounter Medications as of 03/13/2019  Medication Sig  . amLODipine (NORVASC) 10 MG  tablet Take 10 mg by mouth daily.   Marland Kitchen atorvastatin (LIPITOR) 20 MG tablet Take 20 mg by mouth daily.  . cetirizine (ZYRTEC) 10 MG tablet Take 10 mg by mouth daily as needed for allergies.   . fluticasone (FLONASE) 50 MCG/ACT nasal spray Place 2 sprays into both nostrils daily.   . insulin glargine (LANTUS) 100 UNIT/ML injection Inject 25-30 Units into the skin every morning.   . irbesartan-hydrochlorothiazide (AVALIDE) 300-12.5 MG tablet Take 1 tablet by mouth daily.  . tamsulosin (FLOMAX) 0.4 MG CAPS capsule Take 0.4 mg by mouth at bedtime.   . [DISCONTINUED] Empagliflozin-metFORMIN HCl ER (SYNJARDY XR) 12.03-999 MG TB24 Take 1 tablet by mouth every morning.   . [DISCONTINUED] OVER THE COUNTER MEDICATION Take 3 capsules by mouth  daily. Glucose Stabili-T  . [DISCONTINUED] polyethylene glycol (MIRALAX / GLYCOLAX) packet Take 17 g by mouth daily as needed.   No facility-administered encounter medications on file as of 03/13/2019.     ALLERGIES:  Allergies  Allergen Reactions  . Penicillins     Has patient had a PCN reaction causing immediate rash, facial/tongue/throat swelling, SOB or lightheadedness with hypotension: unknown Has patient had a PCN reaction causing severe rash involving mucus membranes or skin necrosis: unknown Has patient had a PCN reaction that required hospitalization: unknown Has patient had a PCN reaction occurring within the last 10 years: No If all of the above answers are "NO", then may proceed with  . Sulfa Antibiotics   . Oxycodone Nausea And Vomiting     PHYSICAL EXAM:  ECOG Performance status: 1  Vitals:   03/13/19 0935  BP: (!) 155/70  Pulse: 61  Resp: 20  Temp: 98.2 F (36.8 C)  SpO2: 99%   Filed Weights   03/13/19 0935  Weight: 210 lb 11.2 oz (95.6 kg)    Physical Exam Vitals signs reviewed.  Constitutional:      Appearance: Normal appearance.  Cardiovascular:     Rate and Rhythm: Normal rate and regular rhythm.     Heart sounds: Normal heart sounds.  Pulmonary:     Effort: Pulmonary effort is normal.     Breath sounds: Normal breath sounds.  Abdominal:     General: There is no distension.     Palpations: Abdomen is soft. There is no mass.  Musculoskeletal:        General: No swelling.  Skin:    General: Skin is warm.  Neurological:     General: No focal deficit present.     Mental Status: He is alert and oriented to person, place, and time.  Psychiatric:        Mood and Affect: Mood normal.        Behavior: Behavior normal.      LABORATORY DATA:  I have reviewed the labs as listed.  CBC    Component Value Date/Time   WBC 19.3 (H) 12/26/2018 1140   RBC 4.78 12/26/2018 1140   HGB 13.3 12/26/2018 1140   HCT 43.2 12/26/2018 1140   PLT 233  12/26/2018 1140   MCV 90.4 12/26/2018 1140   MCH 27.8 12/26/2018 1140   MCHC 30.8 12/26/2018 1140   RDW 13.4 12/26/2018 1140   LYMPHSABS 13.5 (H) 12/26/2018 1140   MONOABS 0.6 12/26/2018 1140   EOSABS 0.1 12/26/2018 1140   BASOSABS 0.1 12/26/2018 1140   CMP Latest Ref Rng & Units 03/11/2019 12/26/2018 11/28/2018  Glucose 70 - 99 mg/dL - 147(H) 150(H)  BUN 8 - 23 mg/dL - 15  12  Creatinine 0.61 - 1.24 mg/dL 0.90 1.01 1.01  Sodium 135 - 145 mmol/L - 142 139  Potassium 3.5 - 5.1 mmol/L - 3.5 3.4(L)  Chloride 98 - 111 mmol/L - 105 102  CO2 22 - 32 mmol/L - 26 25  Calcium 8.9 - 10.3 mg/dL - 8.9 9.5  Total Protein 6.5 - 8.1 g/dL - 7.3 7.8  Total Bilirubin 0.3 - 1.2 mg/dL - 0.7 0.9  Alkaline Phos 38 - 126 U/L - 71 80  AST 15 - 41 U/L - 19 19  ALT 0 - 44 U/L - 18 16       DIAGNOSTIC IMAGING:  I have independently reviewed the scans and discussed with the patient.   I have reviewed Venita Lick LPN's note and agree with the documentation.  I personally performed a face-to-face visit, made revisions and my assessment and plan is as follows.    ASSESSMENT & PLAN:   Melanoma of skin ([place]) 1.  Stage IIIa (pT2a N1AM0) malignant melanoma: - Resection on 10/15/2018 showing superficial spreading melanoma, 1.2 mm, uninvolved peripheral margins, deep margin uninvolved but close, mitotic index 2/millimeters SQ, pathological staging pT2a, no lymphovascular invasion, no neurotropism - Wide excision and sentinel lymph node mapping on 11/23/2018 showing 1 sentinel lymph node with microscopic melanoma 0.1 mm, no residual tumor. - Lymph node dissection on 12/14/2018 shows 28 lymph nodes negative, 9 sentinel lymph nodes negative. -Foundation 1 testing shows BRAF and KIT mutation negative.  MS-stable.  TMB-15 Muts/Mb.  NRAS Q61K positive. - We reviewed results of CT CAP from 03/11/2019.  Persistent lymphadenopathy in the upper abdomen in the portacaval and hepatoduodenal ligament nodal stations, stable  from CT on 10/19/2018.  No other lymphadenopathy elsewhere in the chest or pelvis. - The lymphadenopathy in the upper abdomen was thought to be secondary to CLL which was diagnosed in November 2019. -He was evaluated by Dr. Harriet Masson at [facility] who recommended against immunotherapy. - In stage IIIa disease, risk of disease recurrence is less than 20% and therefore observation is an option.  Patient is agreeable to this.  We will do CT scans every 3 months during the first year. -I will see him back  after the CT scans in 3 months.  2.  CLL: - Diagnosed by flow cytometry on 10/01/2018, CD5 positive, CD10 negative and CD20 positive population. -He does not have any fevers, night sweats or weight loss.  LDH is normal.  He does not have any cytopenias.  He does not have any adenopathy other than in the upper abdomen. -We will continue to monitor at 6 monthly intervals.   Total time spent is 25 minutes with more than 50% of the time spent face-to-face discussing scan results, treatment options including observation, and coordination of care.  Orders placed this encounter:  Orders Placed This Encounter  Procedures  . CT Abdomen Pelvis W Contrast  . CT Chest W Contrast  . CBC with Differential/Platelet  . Comprehensive metabolic panel  . Lactate dehydrogenase      Derek Jack, MD [place] [phone]

## 2019-03-14 DIAGNOSIS — D485 Neoplasm of uncertain behavior of skin: Secondary | ICD-10-CM | POA: Diagnosis not present

## 2019-03-14 DIAGNOSIS — D225 Melanocytic nevi of trunk: Secondary | ICD-10-CM | POA: Diagnosis not present

## 2019-03-14 DIAGNOSIS — Z08 Encounter for follow-up examination after completed treatment for malignant neoplasm: Secondary | ICD-10-CM | POA: Diagnosis not present

## 2019-03-14 DIAGNOSIS — Z85820 Personal history of malignant melanoma of skin: Secondary | ICD-10-CM | POA: Diagnosis not present

## 2019-03-14 DIAGNOSIS — Z1283 Encounter for screening for malignant neoplasm of skin: Secondary | ICD-10-CM | POA: Diagnosis not present

## 2019-03-14 DIAGNOSIS — D2272 Melanocytic nevi of left lower limb, including hip: Secondary | ICD-10-CM | POA: Diagnosis not present

## 2019-03-19 DIAGNOSIS — G4733 Obstructive sleep apnea (adult) (pediatric): Secondary | ICD-10-CM | POA: Diagnosis not present

## 2019-03-21 DIAGNOSIS — Z0000 Encounter for general adult medical examination without abnormal findings: Secondary | ICD-10-CM | POA: Diagnosis not present

## 2019-03-22 DIAGNOSIS — E1165 Type 2 diabetes mellitus with hyperglycemia: Secondary | ICD-10-CM | POA: Diagnosis not present

## 2019-03-22 DIAGNOSIS — I10 Essential (primary) hypertension: Secondary | ICD-10-CM | POA: Diagnosis not present

## 2019-03-22 DIAGNOSIS — E782 Mixed hyperlipidemia: Secondary | ICD-10-CM | POA: Diagnosis not present

## 2019-03-27 DIAGNOSIS — G4733 Obstructive sleep apnea (adult) (pediatric): Secondary | ICD-10-CM | POA: Diagnosis not present

## 2019-04-27 DIAGNOSIS — G4733 Obstructive sleep apnea (adult) (pediatric): Secondary | ICD-10-CM | POA: Diagnosis not present

## 2019-05-03 DIAGNOSIS — R972 Elevated prostate specific antigen [PSA]: Secondary | ICD-10-CM | POA: Diagnosis not present

## 2019-05-03 DIAGNOSIS — E1165 Type 2 diabetes mellitus with hyperglycemia: Secondary | ICD-10-CM | POA: Diagnosis not present

## 2019-05-03 DIAGNOSIS — E119 Type 2 diabetes mellitus without complications: Secondary | ICD-10-CM | POA: Diagnosis not present

## 2019-05-03 DIAGNOSIS — E782 Mixed hyperlipidemia: Secondary | ICD-10-CM | POA: Diagnosis not present

## 2019-05-03 DIAGNOSIS — C9110 Chronic lymphocytic leukemia of B-cell type not having achieved remission: Secondary | ICD-10-CM | POA: Diagnosis not present

## 2019-05-03 DIAGNOSIS — N400 Enlarged prostate without lower urinary tract symptoms: Secondary | ICD-10-CM | POA: Diagnosis not present

## 2019-05-03 DIAGNOSIS — D72829 Elevated white blood cell count, unspecified: Secondary | ICD-10-CM | POA: Diagnosis not present

## 2019-05-03 DIAGNOSIS — C4359 Malignant melanoma of other part of trunk: Secondary | ICD-10-CM | POA: Diagnosis not present

## 2019-05-03 DIAGNOSIS — I10 Essential (primary) hypertension: Secondary | ICD-10-CM | POA: Diagnosis not present

## 2019-05-03 DIAGNOSIS — J309 Allergic rhinitis, unspecified: Secondary | ICD-10-CM | POA: Diagnosis not present

## 2019-05-15 DIAGNOSIS — D72829 Elevated white blood cell count, unspecified: Secondary | ICD-10-CM | POA: Diagnosis not present

## 2019-05-15 DIAGNOSIS — N400 Enlarged prostate without lower urinary tract symptoms: Secondary | ICD-10-CM | POA: Diagnosis not present

## 2019-05-15 DIAGNOSIS — J309 Allergic rhinitis, unspecified: Secondary | ICD-10-CM | POA: Diagnosis not present

## 2019-05-15 DIAGNOSIS — I10 Essential (primary) hypertension: Secondary | ICD-10-CM | POA: Diagnosis not present

## 2019-05-15 DIAGNOSIS — C9110 Chronic lymphocytic leukemia of B-cell type not having achieved remission: Secondary | ICD-10-CM | POA: Diagnosis not present

## 2019-05-15 DIAGNOSIS — E782 Mixed hyperlipidemia: Secondary | ICD-10-CM | POA: Diagnosis not present

## 2019-05-15 DIAGNOSIS — R972 Elevated prostate specific antigen [PSA]: Secondary | ICD-10-CM | POA: Diagnosis not present

## 2019-05-15 DIAGNOSIS — C4359 Malignant melanoma of other part of trunk: Secondary | ICD-10-CM | POA: Diagnosis not present

## 2019-05-15 DIAGNOSIS — E1165 Type 2 diabetes mellitus with hyperglycemia: Secondary | ICD-10-CM | POA: Diagnosis not present

## 2019-05-20 ENCOUNTER — Encounter: Payer: Self-pay | Admitting: Family Medicine

## 2019-05-20 ENCOUNTER — Ambulatory Visit (INDEPENDENT_AMBULATORY_CARE_PROVIDER_SITE_OTHER): Payer: Medicare PPO | Admitting: Family Medicine

## 2019-05-20 ENCOUNTER — Other Ambulatory Visit: Payer: Self-pay

## 2019-05-20 VITALS — BP 153/73 | HR 66 | Temp 98.2°F | Ht 65.0 in | Wt 219.0 lb

## 2019-05-20 DIAGNOSIS — Z9989 Dependence on other enabling machines and devices: Secondary | ICD-10-CM | POA: Diagnosis not present

## 2019-05-20 DIAGNOSIS — G4733 Obstructive sleep apnea (adult) (pediatric): Secondary | ICD-10-CM

## 2019-05-20 NOTE — Patient Instructions (Signed)
Continue CPAP nightly and for greater than 4 hours each night.   Follow up in 1 year  Sleep Apnea Sleep apnea affects breathing during sleep. It causes breathing to stop for a short time or to become shallow. It can also increase the risk of:  Heart attack.  Stroke.  Being very overweight (obese).  Diabetes.  Heart failure.  Irregular heartbeat. The goal of treatment is to help you breathe normally again. What are the causes? There are three kinds of sleep apnea:  Obstructive sleep apnea. This is caused by a blocked or collapsed airway.  Central sleep apnea. This happens when the brain does not send the right signals to the muscles that control breathing.  Mixed sleep apnea. This is a combination of obstructive and central sleep apnea. The most common cause of this condition is a collapsed or blocked airway. This can happen if:  Your throat muscles are too relaxed.  Your tongue and tonsils are too large.  You are overweight.  Your airway is too small. What increases the risk?  Being overweight.  Smoking.  Having a small airway.  Being older.  Being male.  Drinking alcohol.  Taking medicines to calm yourself (sedatives or tranquilizers).  Having family members with the condition. What are the signs or symptoms?  Trouble staying asleep.  Being sleepy or tired during the day.  Getting angry a lot.  Loud snoring.  Headaches in the morning.  Not being able to focus your mind (concentrate).  Forgetting things.  Less interest in sex.  Mood swings.  Personality changes.  Feelings of sadness (depression).  Waking up a lot during the night to pee (urinate).  Dry mouth.  Sore throat. How is this diagnosed?  Your medical history.  A physical exam.  A test that is done when you are sleeping (sleep study). The test is most often done in a sleep lab but may also be done at home. How is this treated?   Sleeping on your side.  Using a  medicine to get rid of mucus in your nose (decongestant).  Avoiding the use of alcohol, medicines to help you relax, or certain pain medicines (narcotics).  Losing weight, if needed.  Changing your diet.  Not smoking.  Using a machine to open your airway while you sleep, such as: ? An oral appliance. This is a mouthpiece that shifts your lower jaw forward. ? A CPAP device. This device blows air through a mask when you breathe out (exhale). ? An EPAP device. This has valves that you put in each nostril. ? A BPAP device. This device blows air through a mask when you breathe in (inhale) and breathe out.  Having surgery if other treatments do not work. It is important to get treatment for sleep apnea. Without treatment, it can lead to:  High blood pressure.  Coronary artery disease.  In men, not being able to have an erection (impotence).  Reduced thinking ability. Follow these instructions at home: Lifestyle  Make changes that your doctor recommends.  Eat a healthy diet.  Lose weight if needed.  Avoid alcohol, medicines to help you relax, and some pain medicines.  Do not use any products that contain nicotine or tobacco, such as cigarettes, e-cigarettes, and chewing tobacco. If you need help quitting, ask your doctor. General instructions  Take over-the-counter and prescription medicines only as told by your doctor.  If you were given a machine to use while you sleep, use it only as told by  your doctor.  If you are having surgery, make sure to tell your doctor you have sleep apnea. You may need to bring your device with you.  Keep all follow-up visits as told by your doctor. This is important. Contact a doctor if:  The machine that you were given to use during sleep bothers you or does not seem to be working.  You do not get better.  You get worse. Get help right away if:  Your chest hurts.  You have trouble breathing in enough air.  You have an uncomfortable  feeling in your back, arms, or stomach.  You have trouble talking.  One side of your body feels weak.  A part of your face is hanging down. These symptoms may be an emergency. Do not wait to see if the symptoms will go away. Get medical help right away. Call your local emergency services (911 in the [place] Do not drive yourself to the hospital. Summary  This condition affects breathing during sleep.  The most common cause is a collapsed or blocked airway.  The goal of treatment is to help you breathe normally while you sleep. This information is not intended to replace advice given to you by your health care provider. Make sure you discuss any questions you have with your health care provider. Document Released: 08/09/2008 Document Revised: 08/17/2018 Document Reviewed: 06/26/2018 Elsevier Patient Education  2020 [employer].

## 2019-05-20 NOTE — Progress Notes (Addendum)
PATIENT: Mark Huang DOB: [birth year]  REASON FOR VISIT: follow up HISTORY FROM: patient  Chief Complaint  Patient presents with   Follow-up    4 mon f/u. Alone. Rm 2. Patient stated that he needs a different nose piece for his cpap machine.      HISTORY OF PRESENT ILLNESS: Today 05/20/19 Mark Huang is a 68 y.o. male here today for follow up of OSA on CPAP.  He continues to tolerate CPAP therapy well.  Compliance report dated 04/16/2019 through 05/15/2019 reveals that he is using his machine every night.  He is using his machine greater than 4 hours every night.  Average usage was 8 hours and 27 minutes.  AHI was 1.0 on 14 cm of water and EPR of 1.  There was a significant leak noted in the 95th percentile at 42.8.  He reports that at his last visit he was switched from a full facemask to nasal pillows.  He has tolerated this mask much better.  He was also given a chinstrap.  He reports that the chinstrap is too large.  He has the chinstrap with him today which shows significant wear from having to be stretched to fit.  Even in the tightest position it is too large.  HISTORY: (copied from Golden Martin's note on 01/17/2019)  UPDATE 3/5/2020CM Mr. Capell, 68 year old male returns for follow-up with history of obstructive sleep apnea.  Repeat sleep study July 17, 2018 shows severe obstructive sleep apnea.  He needs a new machine.  He returns for reevaluation today without any problems he does have a minor leak.  He has a beard.  CPAP data dated 12/17/2018-01/15/2019 shows compliance greater than 4 hours at 100%.  Average usage 7 hours 45 minutes.  Set pressure 14 cm leaks 95th percentile 54.6.  AHI 3.7 ESS 4.  He returns for reevaluation 8/12/19SAMr. Krisko is a 68 year old right-handed gentleman with an underlying medical history of hypertension, hyperlipidemia, suboptimally controlled diabetes, COPD, BPH, seasonal allergies, history of subdural hematoma with status post surgery in 2017,  and obesity, who was previously diagnosed with obstructive sleep apnea and placed on CPAP therapy. Sleep study testing was over 10 years ago. Prior sleep study results are not available for my review today. A CPAP download was reviewed today from 05/26/2018 through 06/24/2018, which is a total of 30 days, during which time he used his machine every night with percent used days greater than 4 hours at 100%, indicating superb compliance with an average usage of 7 hours and 57 minutes, residual AHI information is not available, pressure high at 20 cm with EPR of 3.I reviewed your office note from 02/15/2018, which you kindly included. He had blood work in your office in April which I reviewed: CBC with differential showed elevated white cell count at 12.2 with absolute lymphocyte count elevated at 6.8. CMP showed sodium level of 146, otherwise normal findings. Lipid profile showed triglycerides of 200, otherwise unremarkable findings. A1c was 7 on 02/13/2018. He is wondering if his pressure needs to be changed. His machine is also older, [employer].He reports difficulty with sleep, he goes to sleep okay but does not stay asleep well. He estimates that he only gets about 3-4 hours of sleep on an average night, has nocturia at least 3 times per average night and also some urinary incontinence overnight. Which he has to wear depends. He does endorse restless leg symptoms and discomfort in his feet including tingling at night, does not like for  the sheets to touch his feet. He sleeps with 4 different pillows and different places to accommodate for discomfort at night. His wife is not able to sleep in the same bed with him. He tosses and turns. Bedtime is generally between 8:30 and 9:30, rise time around 5:30 AM. He has been using a [employer] full face mask, size small, could not tolerate a chinstrap in the past. Overall, with time he has been able to lose weight. His original sleep study was probably 25 years ago  he estimates. This is his second CPAP machine which is about 69 years old he estimates. His brother had sleep apnea as well,sadly,brother died young around age 18 from pancreatic cancer.  His Epworth sleepiness score is 15 out of 24, fatigue score is 46 out of 63. He does not sleep very well. He is a nonsmoker and does not utilize alcohol, drinks caffeine in the form of coffee, 2 cups per day on average. He works at [employer]. He lives with his wife, they have 2 grown children.  REVIEW OF SYSTEMS: Out of a complete 14 system review of symptoms, the patient complains only of the following symptoms, fatigue, and all other reviewed systems are negative.  Epworth Sleepiness Scale 20  ALLERGIES: Allergies  Allergen Reactions   Penicillins     Has patient had a PCN reaction causing immediate rash, facial/tongue/throat swelling, SOB or lightheadedness with hypotension: unknown Has patient had a PCN reaction causing severe rash involving mucus membranes or skin necrosis: unknown Has patient had a PCN reaction that required hospitalization: unknown Has patient had a PCN reaction occurring within the last 10 years: No If all of the above answers are "NO", then may proceed with   Sulfa Antibiotics    Oxycodone Nausea And Vomiting    HOME MEDICATIONS: Outpatient Medications Prior to Visit  Medication Sig Dispense Refill   amLODipine (NORVASC) 10 MG tablet Take 10 mg by mouth daily.      atorvastatin (LIPITOR) 20 MG tablet Take 20 mg by mouth daily.     cetirizine (ZYRTEC) 10 MG tablet Take 10 mg by mouth daily as needed for allergies.      fluticasone (FLONASE) 50 MCG/ACT nasal spray Place 2 sprays into both nostrils daily.      insulin glargine (LANTUS) 100 UNIT/ML injection Inject 25-30 Units into the skin every morning.      irbesartan-hydrochlorothiazide (AVALIDE) 300-12.5 MG tablet Take 1 tablet by mouth daily.     tamsulosin (FLOMAX) 0.4 MG CAPS capsule Take 0.4 mg by mouth at  bedtime.      No facility-administered medications prior to visit.     PAST MEDICAL HISTORY: Past Medical History:  Diagnosis Date   BPH (benign prostatic hyperplasia)    COPD (chronic obstructive pulmonary disease) (HCC)    Diabetes mellitus without complication (HCC)    Hypertension    Seasonal allergies    Sleep apnea with use of continuous positive airway pressure (CPAP)    uses CPAP at night     PAST SURGICAL HISTORY: Past Surgical History:  Procedure Laterality Date   APPENDECTOMY     CRANIOTOMY Left 12/30/2015   Procedure: BURR HOLE HEMATOMA EVACUATION SUBDURAL;  Surgeon: Kary Kos, MD;  Location: [facility];  Service: Neurosurgery;  Laterality: Left;   TONSILLECTOMY      FAMILY HISTORY: History reviewed. No pertinent family history.  SOCIAL HISTORY: Social History   Socioeconomic History   Marital status: Married    Spouse name:  Not on file   Number of children: Not on file   Years of education: Not on file   Highest education level: Not on file  Occupational History   Not on file  Social Needs   Financial resource strain: Not on file   Food insecurity    Worry: Not on file    Inability: Not on file   Transportation needs    Medical: Not on file    Non-medical: Not on file  Tobacco Use   Smoking status: Never Smoker   Smokeless tobacco: Never Used  Substance and Sexual Activity   Alcohol use: No   Drug use: No   Sexual activity: Not on file  Lifestyle   Physical activity    Days per week: Not on file    Minutes per session: Not on file   Stress: Not on file  Relationships   Social connections    Talks on phone: Not on file    Gets together: Not on file    Attends religious service: Not on file    Active member of club or organization: Not on file    Attends meetings of clubs or organizations: Not on file    Relationship status: Not on file   Intimate partner violence    Fear of current or ex partner: Not on file      Emotionally abused: Not on file    Physically abused: Not on file    Forced sexual activity: Not on file  Other Topics Concern   Not on file  Social History Narrative   Not on file      PHYSICAL EXAM  Vitals:   05/20/19 1519  BP: (!) 153/73  Pulse: 66  Temp: 98.2 F (36.8 C)  TempSrc: Oral  Weight: 219 lb (99.3 kg)  Height: 5\' 5"  (1.651 m)   Body mass index is 36.44 kg/m.  Generalized: Well developed, in no acute distress  Cardiology: normal rate and rhythm, no murmur noted Neck circumference 18.25" Neurological examination  Mentation: Alert oriented to time, place, history taking. Follows all commands speech and language fluent Cranial nerve II-XII: Pupils were equal round reactive to light. Extraocular movements were full, visual field were full on confrontational test. Facial sensation and strength were normal. Uvula tongue midline. Head turning and shoulder shrug  were normal and symmetric. Motor: The motor testing reveals 5 over 5 strength of all 4 extremities. Good symmetric motor tone is noted throughout.  Gait and station: Gait is normal.  DIAGNOSTIC DATA (LABS, IMAGING, TESTING) - I reviewed patient records, labs, notes, testing and imaging myself where available.  No flowsheet data found.   Lab Results  Component Value Date   WBC 19.3 (H) 12/26/2018   HGB 13.3 12/26/2018   HCT 43.2 12/26/2018   MCV 90.4 12/26/2018   PLT 233 12/26/2018      Component Value Date/Time   NA 142 12/26/2018 1140   K 3.5 12/26/2018 1140   CL 105 12/26/2018 1140   CO2 26 12/26/2018 1140   GLUCOSE 147 (H) 12/26/2018 1140   BUN 15 12/26/2018 1140   CREATININE 0.90 03/11/2019 0912   CALCIUM 8.9 12/26/2018 1140   PROT 7.3 12/26/2018 1140   ALBUMIN 4.3 12/26/2018 1140   AST 19 12/26/2018 1140   ALT 18 12/26/2018 1140   ALKPHOS 71 12/26/2018 1140   BILITOT 0.7 12/26/2018 1140   GFRNONAA >60 12/26/2018 1140   GFRAA >60 12/26/2018 1140   No results found for: CHOL,  HDL, LDLCALC, LDLDIRECT, TRIG, CHOLHDL No results found for: HGBA1C No results found for: VITAMINB12 No results found for: TSH   ASSESSMENT AND PLAN 68 y.o. year old male  has a past medical history of BPH (benign prostatic hyperplasia), COPD (chronic obstructive pulmonary disease) ([place]), Diabetes mellitus without complication ([place]), Hypertension, Seasonal allergies, and Sleep apnea with use of continuous positive airway pressure (CPAP). here with    ICD-10-CM   1. Obstructive sleep apnea treated with continuous positive airway pressure (CPAP)  G47.33    Z99.89      Mr. Iwanicki is doing very well on CPAP.  Compliance data shows excellent compliance.  Currently, he is a mouth breather.  Chinstrap ordered at last visit was too large.  We will send a new order to [facility] [employer] today requesting a medium chinstrap.  He was encouraged to continue using CPAP nightly and for greater than 4 hours each night.  We will repeat a compliance download in 1 month.  If leak is corrected with new chinstrap he will follow-up with [place] in 1 year.  We will adjust follow-up pending compliance data.  He verbalizes understanding and agreement with this plan.    No orders of the defined types were placed in this encounter.    No orders of the defined types were placed in this encounter.     I spent 15 minutes with the patient. 50% of this time was spent counseling and educating patient on plan of care and medications.    Debbora Presto, FNP-C 05/20/2019, 4:15 PM [facility] [address] [place], [place] [address] [phone]  I reviewed the above note and documentation by the Nurse Practitioner and agree with the history, exam, assessment and plan as outlined above. I was immediately available for consultation. Star Age, MD, PhD [facility])

## 2019-05-27 DIAGNOSIS — G4733 Obstructive sleep apnea (adult) (pediatric): Secondary | ICD-10-CM | POA: Diagnosis not present

## 2019-06-06 DIAGNOSIS — Z85820 Personal history of malignant melanoma of skin: Secondary | ICD-10-CM | POA: Diagnosis not present

## 2019-06-06 DIAGNOSIS — Z08 Encounter for follow-up examination after completed treatment for malignant neoplasm: Secondary | ICD-10-CM | POA: Diagnosis not present

## 2019-06-06 DIAGNOSIS — D225 Melanocytic nevi of trunk: Secondary | ICD-10-CM | POA: Diagnosis not present

## 2019-06-06 DIAGNOSIS — L919 Hypertrophic disorder of the skin, unspecified: Secondary | ICD-10-CM | POA: Diagnosis not present

## 2019-06-06 DIAGNOSIS — C44319 Basal cell carcinoma of skin of other parts of face: Secondary | ICD-10-CM | POA: Diagnosis not present

## 2019-06-06 DIAGNOSIS — D235 Other benign neoplasm of skin of trunk: Secondary | ICD-10-CM | POA: Diagnosis not present

## 2019-06-06 DIAGNOSIS — Z1283 Encounter for screening for malignant neoplasm of skin: Secondary | ICD-10-CM | POA: Diagnosis not present

## 2019-06-06 DIAGNOSIS — D485 Neoplasm of uncertain behavior of skin: Secondary | ICD-10-CM | POA: Diagnosis not present

## 2019-06-12 ENCOUNTER — Ambulatory Visit (HOSPITAL_COMMUNITY): Payer: Medicare PPO

## 2019-06-12 ENCOUNTER — Other Ambulatory Visit (HOSPITAL_COMMUNITY): Payer: Medicare PPO

## 2019-06-19 ENCOUNTER — Ambulatory Visit (HOSPITAL_COMMUNITY): Payer: Medicare PPO | Admitting: Hematology

## 2019-06-27 DIAGNOSIS — G4733 Obstructive sleep apnea (adult) (pediatric): Secondary | ICD-10-CM | POA: Diagnosis not present

## 2019-07-01 DIAGNOSIS — D72829 Elevated white blood cell count, unspecified: Secondary | ICD-10-CM | POA: Diagnosis not present

## 2019-07-01 DIAGNOSIS — E1165 Type 2 diabetes mellitus with hyperglycemia: Secondary | ICD-10-CM | POA: Diagnosis not present

## 2019-07-01 DIAGNOSIS — J309 Allergic rhinitis, unspecified: Secondary | ICD-10-CM | POA: Diagnosis not present

## 2019-07-01 DIAGNOSIS — E782 Mixed hyperlipidemia: Secondary | ICD-10-CM | POA: Diagnosis not present

## 2019-07-01 DIAGNOSIS — C9110 Chronic lymphocytic leukemia of B-cell type not having achieved remission: Secondary | ICD-10-CM | POA: Diagnosis not present

## 2019-07-01 DIAGNOSIS — I10 Essential (primary) hypertension: Secondary | ICD-10-CM | POA: Diagnosis not present

## 2019-07-01 DIAGNOSIS — G4733 Obstructive sleep apnea (adult) (pediatric): Secondary | ICD-10-CM | POA: Diagnosis not present

## 2019-07-11 DIAGNOSIS — Z85828 Personal history of other malignant neoplasm of skin: Secondary | ICD-10-CM | POA: Diagnosis not present

## 2019-07-11 DIAGNOSIS — Z08 Encounter for follow-up examination after completed treatment for malignant neoplasm: Secondary | ICD-10-CM | POA: Diagnosis not present

## 2019-07-11 DIAGNOSIS — L648 Other androgenic alopecia: Secondary | ICD-10-CM | POA: Diagnosis not present

## 2019-07-11 DIAGNOSIS — C44319 Basal cell carcinoma of skin of other parts of face: Secondary | ICD-10-CM | POA: Diagnosis not present

## 2019-07-11 DIAGNOSIS — L820 Inflamed seborrheic keratosis: Secondary | ICD-10-CM | POA: Diagnosis not present

## 2019-07-12 ENCOUNTER — Other Ambulatory Visit: Payer: Self-pay

## 2019-07-12 ENCOUNTER — Ambulatory Visit (HOSPITAL_COMMUNITY)
Admission: RE | Admit: 2019-07-12 | Discharge: 2019-07-12 | Disposition: A | Discharge status: Home / Self Care | Payer: Medicare PPO | Source: Ambulatory Visit | Attending: Hematology | Admitting: Hematology

## 2019-07-12 ENCOUNTER — Inpatient Hospital Stay (HOSPITAL_COMMUNITY): Payer: Medicare PPO | Attending: Hematology

## 2019-07-12 ENCOUNTER — Encounter (HOSPITAL_COMMUNITY): Payer: Self-pay

## 2019-07-12 DIAGNOSIS — E119 Type 2 diabetes mellitus without complications: Secondary | ICD-10-CM | POA: Insufficient documentation

## 2019-07-12 DIAGNOSIS — C4359 Malignant melanoma of other part of trunk: Secondary | ICD-10-CM | POA: Diagnosis not present

## 2019-07-12 DIAGNOSIS — I10 Essential (primary) hypertension: Secondary | ICD-10-CM | POA: Insufficient documentation

## 2019-07-12 DIAGNOSIS — Z79899 Other long term (current) drug therapy: Secondary | ICD-10-CM | POA: Diagnosis not present

## 2019-07-12 DIAGNOSIS — C9110 Chronic lymphocytic leukemia of B-cell type not having achieved remission: Secondary | ICD-10-CM | POA: Diagnosis not present

## 2019-07-12 DIAGNOSIS — G4730 Sleep apnea, unspecified: Secondary | ICD-10-CM | POA: Diagnosis not present

## 2019-07-12 DIAGNOSIS — C434 Malignant melanoma of scalp and neck: Secondary | ICD-10-CM | POA: Diagnosis not present

## 2019-07-12 DIAGNOSIS — N400 Enlarged prostate without lower urinary tract symptoms: Secondary | ICD-10-CM | POA: Insufficient documentation

## 2019-07-12 DIAGNOSIS — Z794 Long term (current) use of insulin: Secondary | ICD-10-CM | POA: Insufficient documentation

## 2019-07-12 DIAGNOSIS — C761 Malignant neoplasm of thorax: Secondary | ICD-10-CM | POA: Diagnosis not present

## 2019-07-12 HISTORY — DX: Malignant (primary) neoplasm, unspecified: C80.1

## 2019-07-12 LAB — COMPREHENSIVE METABOLIC PANEL
ALT: 35 U/L (ref 0–44)
AST: 32 U/L (ref 15–41)
Albumin: 4.3 g/dL (ref 3.5–5.0)
Alkaline Phosphatase: 90 U/L (ref 38–126)
Anion gap: 11 (ref 5–15)
BUN: 13 mg/dL (ref 8–23)
CO2: 27 mmol/L (ref 22–32)
Calcium: 9.3 mg/dL (ref 8.9–10.3)
Chloride: 103 mmol/L (ref 98–111)
Creatinine, Ser: 1.04 mg/dL (ref 0.61–1.24)
GFR calc Af Amer: >60 mL/min (ref >60)
GFR calc non Af Amer: >60 mL/min (ref >60)
Glucose, Bld: 236 mg/dL — ABNORMAL HIGH (ref 70–99)
Potassium: 3.7 mmol/L (ref 3.5–5.1)
Sodium: 141 mmol/L (ref 135–145)
Total Bilirubin: 0.6 mg/dL (ref 0.3–1.2)
Total Protein: 7.6 g/dL (ref 6.5–8.1)

## 2019-07-12 LAB — CBC WITH DIFFERENTIAL/PLATELET
Abs Immature Granulocytes: 0.06 10*3/uL (ref 0.00–0.07)
Basophils Absolute: 0.1 10*3/uL (ref 0.0–0.1)
Basophils Relative: 0 %
Eosinophils Absolute: 0.2 10*3/uL (ref 0.0–0.5)
Eosinophils Relative: 1 %
HCT: 43.8 % (ref 39.0–52.0)
Hemoglobin: 13.8 g/dL (ref 13.0–17.0)
Immature Granulocytes: 0 %
Lymphocytes Relative: 73 %
Lymphs Abs: 14 10*3/uL — ABNORMAL HIGH (ref 0.7–4.0)
MCH: 28 pg (ref 26.0–34.0)
MCHC: 31.5 g/dL (ref 30.0–36.0)
MCV: 89 fL (ref 80.0–100.0)
Monocytes Absolute: 0.7 10*3/uL (ref 0.1–1.0)
Monocytes Relative: 4 %
Neutro Abs: 4.1 10*3/uL (ref 1.7–7.7)
Neutrophils Relative %: 22 %
Platelets: 226 10*3/uL (ref 150–400)
RBC: 4.92 MIL/uL (ref 4.22–5.81)
RDW: 13.9 % (ref 11.5–15.5)
WBC: 19.1 10*3/uL — ABNORMAL HIGH (ref 4.0–10.5)
nRBC: 0 % (ref 0.0–0.2)

## 2019-07-12 LAB — LACTATE DEHYDROGENASE: LDH: 146 U/L (ref 98–192)

## 2019-07-12 MED ORDER — IOHEXOL 300 MG/ML  SOLN
100.0000 mL | Freq: Once | INTRAMUSCULAR | Status: AC | PRN
  Administered 2019-07-12: 100 mL via INTRAVENOUS

## 2019-07-18 ENCOUNTER — Inpatient Hospital Stay (HOSPITAL_COMMUNITY): Payer: Medicare PPO | Attending: Hematology | Admitting: Hematology

## 2019-07-18 ENCOUNTER — Encounter (HOSPITAL_COMMUNITY): Payer: Self-pay | Admitting: Hematology

## 2019-07-18 ENCOUNTER — Other Ambulatory Visit: Payer: Self-pay

## 2019-07-18 VITALS — HR 70 | Temp 98.2°F | Resp 16 | Wt 218.8 lb

## 2019-07-18 DIAGNOSIS — Z79899 Other long term (current) drug therapy: Secondary | ICD-10-CM | POA: Diagnosis not present

## 2019-07-18 DIAGNOSIS — C4359 Malignant melanoma of other part of trunk: Secondary | ICD-10-CM | POA: Insufficient documentation

## 2019-07-18 DIAGNOSIS — G4730 Sleep apnea, unspecified: Secondary | ICD-10-CM | POA: Insufficient documentation

## 2019-07-18 DIAGNOSIS — C439 Malignant melanoma of skin, unspecified: Secondary | ICD-10-CM

## 2019-07-18 DIAGNOSIS — C9110 Chronic lymphocytic leukemia of B-cell type not having achieved remission: Secondary | ICD-10-CM | POA: Diagnosis not present

## 2019-07-18 DIAGNOSIS — C434 Malignant melanoma of scalp and neck: Secondary | ICD-10-CM | POA: Diagnosis not present

## 2019-07-18 NOTE — Assessment & Plan Note (Signed)
1.  Stage IIIa (pT2a N1AM0) malignant melanoma: - Resection on 10/15/2018 showing superficial spreading melanoma, 1.2 mm, uninvolved peripheral margins, deep margin uninvolved but close, mitotic index 2/millimeters SQ, pathological staging pT2a, no lymphovascular invasion, no neurotropism - Wide excision and sentinel lymph node mapping on 11/23/2018 showing 1 sentinel lymph node with microscopic melanoma 0.1 mm, no residual tumor. - Lymph node dissection on 12/14/2018 shows 28 lymph nodes negative, 9 sentinel lymph nodes negative. -Foundation 1 testing shows BRAF and KIT mutation negative.  MS-stable.  TMB-15 Muts/Mb.  NRAS Q61K positive. -He was evaluated by Dr. Harriet Masson at [facility] who recommended against immunotherapy. - In stage IIIa disease, risk of disease recurrence is less than 20% and therefore observation is an option.  Patient is agreeable to this. - CT scan on 07/12/2019 showed mild right infrahilar, gastrocolic ligament and upper retroperitoneal lymphadenopathy stable since April 2020.  Scattered subcentimeter solid pulmonary nodules are all stable.  No new findings. -Abdominal Ennever adenopathy was thought to be secondary to CLL. -Blood work has also been stable.  I plan to repeat blood work and scans in 4 months.  2.  CLL: - Diagnosed by flow cytometry on 10/01/2018, CD5 positive, CD10 negative and CD20 positive population. -Denies any fevers, night sweats or weight loss.  LDH is normal.  Does not have any palpable adenopathy.  Only adenopathy has resolved abdominal adenopathy on the scans.   -We will continue to monitor at six-month intervals.  White count is stable at 19.1.  Hemoglobin and platelets are normal.

## 2019-07-18 NOTE — Patient Instructions (Addendum)
[facility] Discharge Instructions  You were seen today by Dr. Katragadda. He went over your recent lab results. He will see you back in 4 months for labs and follow up.   Thank you for choosing  Cancer Center at [facility] to provide your oncology and hematology care.  To afford each patient quality time with our provider, please arrive at least 15 minutes before your scheduled appointment time.   If you have a lab appointment with the Cancer Center please come in thru the  Main Entrance and check in at the main information desk  You need to re-schedule your appointment should you arrive 10 or more minutes late.  We strive to give you quality time with our providers, and arriving late affects you and other patients whose appointments are after yours.  Also, if you no show three or more times for appointments you may be dismissed from the clinic at the providers discretion.     Again, thank you for choosing [facility].  Our hope is that these requests will decrease the amount of time that you wait before being seen by our physicians.       _____________________________________________________________  Should you have questions after your visit to [facility], please contact our office at [phone] between the hours of 8:00 a.m. and 4:30 p.m.  Voicemails left after 4:00 p.m. will not be returned until the following business day.  For prescription refill requests, have your pharmacy contact our office and allow 72 hours.    Cancer Center Support Programs:   > Cancer Support Group  2nd Tuesday of the month 1pm-2pm, Journey Room    

## 2019-07-18 NOTE — Progress Notes (Signed)
[facility] [address], [place] [address]   CLINIC:  Medical Oncology/Hematology  PCP:  Celene Squibb, MD [place] [address] [phone]   REASON FOR VISIT:  Follow-up for CLL and malignant melanoma.    INTERVAL HISTORY:  Mark Huang 68 y.o. male seen for follow-up of melanoma and CLL.  Denies any fevers, night sweats or weight loss in the last 6 months.  Appetite is 100%.  Energy levels are 75%.  Recently had some skin lesions frozen on the left side of the face.  Sleep problems are stable.  Ankle swelling is also stable.  Denies any nausea, vomiting, diarrhea or constipation.  No abdominal pain is reported.  No headaches or vision changes.  No chest pains or lightheadedness.   REVIEW OF SYSTEMS:  Review of Systems  Cardiovascular: Positive for leg swelling.  Psychiatric/Behavioral: Positive for sleep disturbance.  All other systems reviewed and are negative.    PAST MEDICAL/SURGICAL HISTORY:  Past Medical History:  Diagnosis Date  . BPH (benign prostatic hyperplasia)   . Cancer ([place])   . COPD (chronic obstructive pulmonary disease) ([place])   . Diabetes mellitus without complication ([place])   . Hypertension   . Seasonal allergies   . Sleep apnea with use of continuous positive airway pressure (CPAP)    uses CPAP at night    Past Surgical History:  Procedure Laterality Date  . APPENDECTOMY    . CRANIOTOMY Left 12/30/2015   Procedure: BURR HOLE HEMATOMA EVACUATION SUBDURAL;  Surgeon: Kary Kos, MD;  Location: [facility];  Service: Neurosurgery;  Laterality: Left;  . TONSILLECTOMY       SOCIAL HISTORY:  Social History   Socioeconomic History  . Marital status: Married    Spouse name: Not on file  . Number of children: Not on file  . Years of education: Not on file  . Highest education level: Not on file  Occupational History  . Not on file  Social Needs  . Financial resource strain: Not on file  . Food insecurity     Worry: Not on file    Inability: Not on file  . Transportation needs    Medical: Not on file    Non-medical: Not on file  Tobacco Use  . Smoking status: Never Smoker  . Smokeless tobacco: Never Used  Substance and Sexual Activity  . Alcohol use: No  . Drug use: No  . Sexual activity: Not on file  Lifestyle  . Physical activity    Days per week: Not on file    Minutes per session: Not on file  . Stress: Not on file  Relationships  . Social Herbalist on phone: Not on file    Gets together: Not on file    Attends religious service: Not on file    Active member of club or organization: Not on file    Attends meetings of clubs or organizations: Not on file    Relationship status: Not on file  . Intimate partner violence    Fear of current or ex partner: Not on file    Emotionally abused: Not on file    Physically abused: Not on file    Forced sexual activity: Not on file  Other Topics Concern  . Not on file  Social History Narrative  . Not on file    FAMILY HISTORY:  History reviewed. No pertinent family history.  CURRENT MEDICATIONS:  Outpatient  Encounter Medications as of 07/18/2019  Medication Sig  . amLODipine (NORVASC) 10 MG tablet Take 10 mg by mouth daily.   Marland Kitchen atorvastatin (LIPITOR) 20 MG tablet Take 20 mg by mouth daily.  . cetirizine (ZYRTEC) 10 MG tablet Take 10 mg by mouth daily as needed for allergies.   . fluticasone (FLONASE) 50 MCG/ACT nasal spray Place 2 sprays into both nostrils daily.   . insulin glargine (LANTUS) 100 UNIT/ML injection Inject 25-30 Units into the skin every morning.   . irbesartan-hydrochlorothiazide (AVALIDE) 300-12.5 MG tablet Take 1 tablet by mouth daily.  . tamsulosin (FLOMAX) 0.4 MG CAPS capsule Take 0.4 mg by mouth at bedtime.    No facility-administered encounter medications on file as of 07/18/2019.     ALLERGIES:  Allergies  Allergen Reactions  . Penicillins     Has patient had a PCN reaction causing immediate  rash, facial/tongue/throat swelling, SOB or lightheadedness with hypotension: unknown Has patient had a PCN reaction causing severe rash involving mucus membranes or skin necrosis: unknown Has patient had a PCN reaction that required hospitalization: unknown Has patient had a PCN reaction occurring within the last 10 years: No If all of the above answers are "NO", then may proceed with  . Sulfa Antibiotics   . Oxycodone Nausea And Vomiting     PHYSICAL EXAM:  ECOG Performance status: 1  Vitals:   07/18/19 1125  Pulse: 70  Resp: 16  Temp: 98.2 F (36.8 C)  SpO2: 99%   Filed Weights   07/18/19 1125  Weight: 218 lb 12.8 oz (99.2 kg)    Physical Exam Vitals signs reviewed.  Constitutional:      Appearance: Normal appearance.  Cardiovascular:     Rate and Rhythm: Normal rate and regular rhythm.     Heart sounds: Normal heart sounds.  Pulmonary:     Effort: Pulmonary effort is normal.     Breath sounds: Normal breath sounds.  Abdominal:     General: There is no distension.     Palpations: Abdomen is soft. There is no mass.  Musculoskeletal:        General: No swelling.  Skin:    General: Skin is warm.  Neurological:     General: No focal deficit present.     Mental Status: He is alert and oriented to person, place, and time.  Psychiatric:        Mood and Affect: Mood normal.        Behavior: Behavior normal.      LABORATORY DATA:  I have reviewed the labs as listed.  CBC    Component Value Date/Time   WBC 19.1 (H) 07/12/2019 0816   RBC 4.92 07/12/2019 0816   HGB 13.8 07/12/2019 0816   HCT 43.8 07/12/2019 0816   PLT 226 07/12/2019 0816   MCV 89.0 07/12/2019 0816   MCH 28.0 07/12/2019 0816   MCHC 31.5 07/12/2019 0816   RDW 13.9 07/12/2019 0816   LYMPHSABS 14.0 (H) 07/12/2019 0816   MONOABS 0.7 07/12/2019 0816   EOSABS 0.2 07/12/2019 0816   BASOSABS 0.1 07/12/2019 0816   CMP Latest Ref Rng & Units 07/12/2019 03/11/2019 12/26/2018  Glucose 70 - 99 mg/dL  236(H) - 147(H)  BUN 8 - 23 mg/dL 13 - 15  Creatinine 0.61 - 1.24 mg/dL 1.04 0.90 1.01  Sodium 135 - 145 mmol/L 141 - 142  Potassium 3.5 - 5.1 mmol/L 3.7 - 3.5  Chloride 98 - 111 mmol/L 103 - 105  CO2 22 -  32 mmol/L 27 - 26  Calcium 8.9 - 10.3 mg/dL 9.3 - 8.9  Total Protein 6.5 - 8.1 g/dL 7.6 - 7.3  Total Bilirubin 0.3 - 1.2 mg/dL 0.6 - 0.7  Alkaline Phos 38 - 126 U/L 90 - 71  AST 15 - 41 U/L 32 - 19  ALT 0 - 44 U/L 35 - 18       DIAGNOSTIC IMAGING:  I have independently reviewed the scans and discussed with the patient.   I have reviewed Venita Lick LPN's note and agree with the documentation.  I personally performed a face-to-face visit, made revisions and my assessment and plan is as follows.    ASSESSMENT & PLAN:   Melanoma of skin ([place]) 1.  Stage IIIa (pT2a N1AM0) malignant melanoma: - Resection on 10/15/2018 showing superficial spreading melanoma, 1.2 mm, uninvolved peripheral margins, deep margin uninvolved but close, mitotic index 2/millimeters SQ, pathological staging pT2a, no lymphovascular invasion, no neurotropism - Wide excision and sentinel lymph node mapping on 11/23/2018 showing 1 sentinel lymph node with microscopic melanoma 0.1 mm, no residual tumor. - Lymph node dissection on 12/14/2018 shows 28 lymph nodes negative, 9 sentinel lymph nodes negative. -Foundation 1 testing shows BRAF and KIT mutation negative.  MS-stable.  TMB-15 Muts/Mb.  NRAS Q61K positive. -He was evaluated by Dr. Harriet Masson at [facility] who recommended against immunotherapy. - In stage IIIa disease, risk of disease recurrence is less than 20% and therefore observation is an option.  Patient is agreeable to this. - CT scan on 07/12/2019 showed mild right infrahilar, gastrocolic ligament and upper retroperitoneal lymphadenopathy stable since April 2020.  Scattered subcentimeter solid pulmonary nodules are all stable.  No new findings. -Abdominal Ennever adenopathy was thought to be secondary to CLL.  -Blood work has also been stable.  I plan to repeat blood work and scans in 4 months.  2.  CLL: - Diagnosed by flow cytometry on 10/01/2018, CD5 positive, CD10 negative and CD20 positive population. -Denies any fevers, night sweats or weight loss.  LDH is normal.  Does not have any palpable adenopathy.  Only adenopathy has resolved abdominal adenopathy on the scans.   -We will continue to monitor at six-month intervals.  White count is stable at 19.1.  Hemoglobin and platelets are normal.   Total time spent is 25 minutes with more than 50% of the time spent face-to-face discussing scan results, counseling and coordination of care.  Orders placed this encounter:  Orders Placed This Encounter  Procedures  . CT Abdomen Pelvis W Contrast  . CT Chest W Contrast  . CBC with Differential/Platelet  . Comprehensive metabolic panel  . Lactate dehydrogenase      Derek Jack, MD [place] [phone]

## 2019-07-28 DIAGNOSIS — G4733 Obstructive sleep apnea (adult) (pediatric): Secondary | ICD-10-CM | POA: Diagnosis not present

## 2019-08-19 DIAGNOSIS — I10 Essential (primary) hypertension: Secondary | ICD-10-CM | POA: Diagnosis not present

## 2019-08-19 DIAGNOSIS — E782 Mixed hyperlipidemia: Secondary | ICD-10-CM | POA: Diagnosis not present

## 2019-08-19 DIAGNOSIS — E1165 Type 2 diabetes mellitus with hyperglycemia: Secondary | ICD-10-CM | POA: Diagnosis not present

## 2019-08-19 DIAGNOSIS — E119 Type 2 diabetes mellitus without complications: Secondary | ICD-10-CM | POA: Diagnosis not present

## 2019-08-22 DIAGNOSIS — E1165 Type 2 diabetes mellitus with hyperglycemia: Secondary | ICD-10-CM | POA: Diagnosis not present

## 2019-08-22 DIAGNOSIS — C9110 Chronic lymphocytic leukemia of B-cell type not having achieved remission: Secondary | ICD-10-CM | POA: Diagnosis not present

## 2019-08-22 DIAGNOSIS — E782 Mixed hyperlipidemia: Secondary | ICD-10-CM | POA: Diagnosis not present

## 2019-08-22 DIAGNOSIS — C4359 Malignant melanoma of other part of trunk: Secondary | ICD-10-CM | POA: Diagnosis not present

## 2019-08-22 DIAGNOSIS — D72829 Elevated white blood cell count, unspecified: Secondary | ICD-10-CM | POA: Diagnosis not present

## 2019-08-22 DIAGNOSIS — I10 Essential (primary) hypertension: Secondary | ICD-10-CM | POA: Diagnosis not present

## 2019-08-22 DIAGNOSIS — N400 Enlarged prostate without lower urinary tract symptoms: Secondary | ICD-10-CM | POA: Diagnosis not present

## 2019-08-22 DIAGNOSIS — Z23 Encounter for immunization: Secondary | ICD-10-CM | POA: Diagnosis not present

## 2019-08-22 DIAGNOSIS — R972 Elevated prostate specific antigen [PSA]: Secondary | ICD-10-CM | POA: Diagnosis not present

## 2019-08-27 DIAGNOSIS — G4733 Obstructive sleep apnea (adult) (pediatric): Secondary | ICD-10-CM | POA: Diagnosis not present

## 2019-09-02 DIAGNOSIS — Z08 Encounter for follow-up examination after completed treatment for malignant neoplasm: Secondary | ICD-10-CM | POA: Diagnosis not present

## 2019-09-02 DIAGNOSIS — L281 Prurigo nodularis: Secondary | ICD-10-CM | POA: Diagnosis not present

## 2019-09-02 DIAGNOSIS — L570 Actinic keratosis: Secondary | ICD-10-CM | POA: Diagnosis not present

## 2019-09-02 DIAGNOSIS — X32XXXD Exposure to sunlight, subsequent encounter: Secondary | ICD-10-CM | POA: Diagnosis not present

## 2019-09-02 DIAGNOSIS — D225 Melanocytic nevi of trunk: Secondary | ICD-10-CM | POA: Diagnosis not present

## 2019-09-02 DIAGNOSIS — D2262 Melanocytic nevi of left upper limb, including shoulder: Secondary | ICD-10-CM | POA: Diagnosis not present

## 2019-09-02 DIAGNOSIS — Z85820 Personal history of malignant melanoma of skin: Secondary | ICD-10-CM | POA: Diagnosis not present

## 2019-09-02 DIAGNOSIS — Z1283 Encounter for screening for malignant neoplasm of skin: Secondary | ICD-10-CM | POA: Diagnosis not present

## 2019-09-02 DIAGNOSIS — D485 Neoplasm of uncertain behavior of skin: Secondary | ICD-10-CM | POA: Diagnosis not present

## 2019-09-27 DIAGNOSIS — G4733 Obstructive sleep apnea (adult) (pediatric): Secondary | ICD-10-CM | POA: Diagnosis not present

## 2019-10-02 DIAGNOSIS — C4359 Malignant melanoma of other part of trunk: Secondary | ICD-10-CM | POA: Diagnosis not present

## 2019-10-02 DIAGNOSIS — I10 Essential (primary) hypertension: Secondary | ICD-10-CM | POA: Diagnosis not present

## 2019-10-02 DIAGNOSIS — R972 Elevated prostate specific antigen [PSA]: Secondary | ICD-10-CM | POA: Diagnosis not present

## 2019-10-02 DIAGNOSIS — E782 Mixed hyperlipidemia: Secondary | ICD-10-CM | POA: Diagnosis not present

## 2019-10-02 DIAGNOSIS — J309 Allergic rhinitis, unspecified: Secondary | ICD-10-CM | POA: Diagnosis not present

## 2019-10-02 DIAGNOSIS — E1165 Type 2 diabetes mellitus with hyperglycemia: Secondary | ICD-10-CM | POA: Diagnosis not present

## 2019-10-02 DIAGNOSIS — N400 Enlarged prostate without lower urinary tract symptoms: Secondary | ICD-10-CM | POA: Diagnosis not present

## 2019-10-02 DIAGNOSIS — C9110 Chronic lymphocytic leukemia of B-cell type not having achieved remission: Secondary | ICD-10-CM | POA: Diagnosis not present

## 2019-10-02 DIAGNOSIS — D72829 Elevated white blood cell count, unspecified: Secondary | ICD-10-CM | POA: Diagnosis not present

## 2019-10-21 DIAGNOSIS — G4733 Obstructive sleep apnea (adult) (pediatric): Secondary | ICD-10-CM | POA: Diagnosis not present

## 2019-10-27 DIAGNOSIS — G4733 Obstructive sleep apnea (adult) (pediatric): Secondary | ICD-10-CM | POA: Diagnosis not present

## 2019-11-14 ENCOUNTER — Telehealth: Payer: Self-pay | Admitting: Hematology

## 2019-11-18 ENCOUNTER — Inpatient Hospital Stay (HOSPITAL_COMMUNITY): Payer: Medicare PPO | Attending: Hematology

## 2019-11-18 ENCOUNTER — Ambulatory Visit (HOSPITAL_COMMUNITY): Payer: Medicare PPO

## 2019-11-18 DIAGNOSIS — C434 Malignant melanoma of scalp and neck: Secondary | ICD-10-CM | POA: Insufficient documentation

## 2019-11-18 DIAGNOSIS — G4730 Sleep apnea, unspecified: Secondary | ICD-10-CM | POA: Insufficient documentation

## 2019-11-18 DIAGNOSIS — Z79899 Other long term (current) drug therapy: Secondary | ICD-10-CM | POA: Insufficient documentation

## 2019-11-18 DIAGNOSIS — C9110 Chronic lymphocytic leukemia of B-cell type not having achieved remission: Secondary | ICD-10-CM | POA: Insufficient documentation

## 2019-11-18 DIAGNOSIS — C4359 Malignant melanoma of other part of trunk: Secondary | ICD-10-CM | POA: Insufficient documentation

## 2019-11-20 ENCOUNTER — Ambulatory Visit (HOSPITAL_COMMUNITY): Payer: Medicare PPO | Admitting: Hematology

## 2019-11-27 DIAGNOSIS — G4733 Obstructive sleep apnea (adult) (pediatric): Secondary | ICD-10-CM | POA: Diagnosis not present

## 2019-12-02 DIAGNOSIS — C8290 Follicular lymphoma, unspecified, unspecified site: Secondary | ICD-10-CM | POA: Diagnosis not present

## 2019-12-02 DIAGNOSIS — C8510 Unspecified B-cell lymphoma, unspecified site: Secondary | ICD-10-CM | POA: Diagnosis not present

## 2019-12-02 DIAGNOSIS — Z1283 Encounter for screening for malignant neoplasm of skin: Secondary | ICD-10-CM | POA: Diagnosis not present

## 2019-12-02 DIAGNOSIS — Z08 Encounter for follow-up examination after completed treatment for malignant neoplasm: Secondary | ICD-10-CM | POA: Diagnosis not present

## 2019-12-02 DIAGNOSIS — C4442 Squamous cell carcinoma of skin of scalp and neck: Secondary | ICD-10-CM | POA: Diagnosis not present

## 2019-12-02 DIAGNOSIS — Z85820 Personal history of malignant melanoma of skin: Secondary | ICD-10-CM | POA: Diagnosis not present

## 2019-12-02 DIAGNOSIS — D225 Melanocytic nevi of trunk: Secondary | ICD-10-CM | POA: Diagnosis not present

## 2019-12-03 ENCOUNTER — Ambulatory Visit (HOSPITAL_COMMUNITY)
Admission: RE | Admit: 2019-12-03 | Discharge: 2019-12-03 | Disposition: A | Discharge status: Home / Self Care | Payer: Medicare PPO | Source: Ambulatory Visit | Attending: Hematology | Admitting: Hematology

## 2019-12-03 ENCOUNTER — Other Ambulatory Visit: Payer: Self-pay

## 2019-12-03 ENCOUNTER — Inpatient Hospital Stay (HOSPITAL_COMMUNITY): Payer: Medicare PPO

## 2019-12-03 DIAGNOSIS — C9110 Chronic lymphocytic leukemia of B-cell type not having achieved remission: Secondary | ICD-10-CM | POA: Diagnosis not present

## 2019-12-03 DIAGNOSIS — N281 Cyst of kidney, acquired: Secondary | ICD-10-CM | POA: Diagnosis not present

## 2019-12-03 DIAGNOSIS — Z79899 Other long term (current) drug therapy: Secondary | ICD-10-CM | POA: Diagnosis not present

## 2019-12-03 DIAGNOSIS — C4359 Malignant melanoma of other part of trunk: Secondary | ICD-10-CM | POA: Diagnosis not present

## 2019-12-03 DIAGNOSIS — C439 Malignant melanoma of skin, unspecified: Secondary | ICD-10-CM | POA: Insufficient documentation

## 2019-12-03 DIAGNOSIS — K5730 Diverticulosis of large intestine without perforation or abscess without bleeding: Secondary | ICD-10-CM | POA: Diagnosis not present

## 2019-12-03 DIAGNOSIS — C434 Malignant melanoma of scalp and neck: Secondary | ICD-10-CM | POA: Diagnosis not present

## 2019-12-03 DIAGNOSIS — G4730 Sleep apnea, unspecified: Secondary | ICD-10-CM | POA: Diagnosis not present

## 2019-12-03 LAB — COMPREHENSIVE METABOLIC PANEL
ALT: 44 U/L (ref 0–44)
AST: 40 U/L (ref 15–41)
Albumin: 4 g/dL (ref 3.5–5.0)
Alkaline Phosphatase: 74 U/L (ref 38–126)
Anion gap: 10 (ref 5–15)
BUN: 11 mg/dL (ref 8–23)
CO2: 27 mmol/L (ref 22–32)
Calcium: 8.9 mg/dL (ref 8.9–10.3)
Chloride: 102 mmol/L (ref 98–111)
Creatinine, Ser: 0.81 mg/dL (ref 0.61–1.24)
GFR calc Af Amer: >60 mL/min (ref >60)
GFR calc non Af Amer: >60 mL/min (ref >60)
Glucose, Bld: 153 mg/dL — ABNORMAL HIGH (ref 70–99)
Potassium: 3.6 mmol/L (ref 3.5–5.1)
Sodium: 139 mmol/L (ref 135–145)
Total Bilirubin: 0.6 mg/dL (ref 0.3–1.2)
Total Protein: 7.5 g/dL (ref 6.5–8.1)

## 2019-12-03 LAB — CBC WITH DIFFERENTIAL/PLATELET
Abs Immature Granulocytes: 0.08 10*3/uL — ABNORMAL HIGH (ref 0.00–0.07)
Basophils Absolute: 0.1 10*3/uL (ref 0.0–0.1)
Basophils Relative: 0 %
Eosinophils Absolute: 0.2 10*3/uL (ref 0.0–0.5)
Eosinophils Relative: 1 %
HCT: 43.8 % (ref 39.0–52.0)
Hemoglobin: 13.7 g/dL (ref 13.0–17.0)
Immature Granulocytes: 0 %
Lymphocytes Relative: 72 %
Lymphs Abs: 15.7 10*3/uL — ABNORMAL HIGH (ref 0.7–4.0)
MCH: 27.7 pg (ref 26.0–34.0)
MCHC: 31.3 g/dL (ref 30.0–36.0)
MCV: 88.5 fL (ref 80.0–100.0)
Monocytes Absolute: 0.7 10*3/uL (ref 0.1–1.0)
Monocytes Relative: 3 %
Neutro Abs: 5.2 10*3/uL (ref 1.7–7.7)
Neutrophils Relative %: 24 %
Platelets: 246 10*3/uL (ref 150–400)
RBC: 4.95 MIL/uL (ref 4.22–5.81)
RDW: 13.6 % (ref 11.5–15.5)
WBC: 21.8 10*3/uL — ABNORMAL HIGH (ref 4.0–10.5)
nRBC: 0 % (ref 0.0–0.2)

## 2019-12-03 LAB — LACTATE DEHYDROGENASE: LDH: 161 U/L (ref 98–192)

## 2019-12-03 LAB — POCT I-STAT CREATININE: Creatinine, Ser: 0.8 mg/dL (ref 0.61–1.24)

## 2019-12-03 MED ORDER — IOHEXOL 300 MG/ML  SOLN
100.0000 mL | Freq: Once | INTRAMUSCULAR | Status: AC | PRN
  Administered 2019-12-03: 100 mL via INTRAVENOUS

## 2019-12-05 ENCOUNTER — Inpatient Hospital Stay (HOSPITAL_BASED_OUTPATIENT_CLINIC_OR_DEPARTMENT_OTHER): Payer: Medicare PPO | Admitting: Hematology

## 2019-12-05 ENCOUNTER — Other Ambulatory Visit: Payer: Self-pay

## 2019-12-05 VITALS — BP 159/86 | HR 70 | Temp 97.8°F | Resp 20 | Wt 222.2 lb

## 2019-12-05 DIAGNOSIS — C9110 Chronic lymphocytic leukemia of B-cell type not having achieved remission: Secondary | ICD-10-CM

## 2019-12-05 DIAGNOSIS — C439 Malignant melanoma of skin, unspecified: Secondary | ICD-10-CM | POA: Diagnosis not present

## 2019-12-05 DIAGNOSIS — C4359 Malignant melanoma of other part of trunk: Secondary | ICD-10-CM | POA: Diagnosis not present

## 2019-12-05 DIAGNOSIS — Z79899 Other long term (current) drug therapy: Secondary | ICD-10-CM | POA: Diagnosis not present

## 2019-12-05 DIAGNOSIS — C434 Malignant melanoma of scalp and neck: Secondary | ICD-10-CM | POA: Diagnosis not present

## 2019-12-05 DIAGNOSIS — G4730 Sleep apnea, unspecified: Secondary | ICD-10-CM | POA: Diagnosis not present

## 2019-12-05 NOTE — Patient Instructions (Addendum)
[place] at [facility] Discharge Instructions  You were seen today by Dr. Delton Coombes. He went over your recent lab and scanresults. He will repeat your scans prior to your next visit. He will see you back in 6 months for labs and follow up.   Thank you for choosing [place] at [facility] to provide your oncology and hematology care.  To afford each patient quality time with our provider, please arrive at least 15 minutes before your scheduled appointment time.   If you have a lab appointment with the [facility] please come in thru the  Main Entrance and check in at the main information desk  You need to re-schedule your appointment should you arrive 10 or more minutes late.  We strive to give you quality time with our providers, and arriving late affects you and other patients whose appointments are after yours.  Also, if you no show three or more times for appointments you may be dismissed from the clinic at the providers discretion.     Again, thank you for choosing [facility].  Our hope is that these requests will decrease the amount of time that you wait before being seen by our physicians.       _____________________________________________________________  Should you have questions after your visit to [facility], please contact our office at [phone] between the hours of 8:00 a.m. and 4:30 p.m.  Voicemails left after 4:00 p.m. will not be returned until the following business day.  For prescription refill requests, have your pharmacy contact our office and allow 72 hours.    Cancer Center Support Programs:   > Cancer Support Group  2nd Tuesday of the month 1pm-2pm, Journey Room

## 2019-12-06 DIAGNOSIS — D72829 Elevated white blood cell count, unspecified: Secondary | ICD-10-CM | POA: Diagnosis not present

## 2019-12-06 DIAGNOSIS — I10 Essential (primary) hypertension: Secondary | ICD-10-CM | POA: Diagnosis not present

## 2019-12-06 DIAGNOSIS — R972 Elevated prostate specific antigen [PSA]: Secondary | ICD-10-CM | POA: Diagnosis not present

## 2019-12-06 DIAGNOSIS — C4359 Malignant melanoma of other part of trunk: Secondary | ICD-10-CM | POA: Diagnosis not present

## 2019-12-06 DIAGNOSIS — N400 Enlarged prostate without lower urinary tract symptoms: Secondary | ICD-10-CM | POA: Diagnosis not present

## 2019-12-06 DIAGNOSIS — E1165 Type 2 diabetes mellitus with hyperglycemia: Secondary | ICD-10-CM | POA: Diagnosis not present

## 2019-12-06 DIAGNOSIS — C9110 Chronic lymphocytic leukemia of B-cell type not having achieved remission: Secondary | ICD-10-CM | POA: Diagnosis not present

## 2019-12-06 DIAGNOSIS — J309 Allergic rhinitis, unspecified: Secondary | ICD-10-CM | POA: Diagnosis not present

## 2019-12-06 DIAGNOSIS — E7849 Other hyperlipidemia: Secondary | ICD-10-CM | POA: Diagnosis not present

## 2019-12-08 NOTE — Assessment & Plan Note (Signed)
1.  Stage IIIa (pT2a N1AM0) malignant melanoma: - Resection on 10/15/2018 showing superficial spreading melanoma, 1.2 mm, uninvolved peripheral margins, deep margin uninvolved but close, mitotic index 2/millimeters SQ, pathological staging pT2a, no lymphovascular invasion, no neurotropism - Wide excision and sentinel lymph node mapping on 11/23/2018 showing 1 sentinel lymph node with microscopic melanoma 0.1 mm, no residual tumor. - Lymph node dissection on 12/14/2018 shows 28 lymph nodes negative, 9 sentinel lymph nodes negative. -Foundation 1 testing shows BRAF and KIT mutation negative.  MS-stable.  TMB-15 Muts/Mb.  NRAS Q61K positive. -He was evaluated by Dr. Harriet Masson at [facility] who recommended against immunotherapy. - In stage IIIa disease, risk of disease recurrence is less than 20% and therefore observation is an option.  Patient is agreeable to this. - CT scan on 07/12/2019 showed mild right infrahilar, gastrocolic ligament and upper retroperitoneal lymphadenopathy stable since April 2020.  Scattered subcentimeter solid pulmonary nodules are all stable.  No new findings. -We reviewed CT scans dated 12/03/2019.  This showed stable mild right infrahilar, gastro colic ligament, upper retroperitoneal adenopathy. -We also reviewed his labs.  LDH is within normal limits.  LFTs are normal.  I plan to see him back in 6 months for follow-up with repeat scans.  2.  CLL: - Diagnosed by flow cytometry on 10/01/2018, CD5 positive, CD10 negative and CD20 positive population. -No B symptoms.  White count slightly increased to 21.8.  No palpable adenopathy. -We will continue to monitor at 17-monthintervals.

## 2019-12-08 NOTE — Progress Notes (Signed)
[place] [address], [place] [address]   CLINIC:  Medical Oncology/Hematology  PCP:  Celene Squibb, MD [place] [address] [phone]   REASON FOR VISIT:  Follow-up for CLL and malignant melanoma.    INTERVAL HISTORY:  [name] 69 y.o. male seen for follow-up of malignant melanoma and CLL.  Denies any fevers, night sweats or weight loss in the last 6 months.  No new onset pains.  Appetite is 100%.  Energy levels are 50%.  He recently had a suspicious lesion on the vertex of the scalp removed by Dr. Lavone Neri.  Results are pending at this time.  He reports some pain at that place.   REVIEW OF SYSTEMS:  Review of Systems  Cardiovascular: Positive for leg swelling.  Psychiatric/Behavioral: Positive for sleep disturbance.  All other systems reviewed and are negative.    PAST MEDICAL/SURGICAL HISTORY:  Past Medical History:  Diagnosis Date  . BPH (benign prostatic hyperplasia)   . Cancer ([place])   . COPD (chronic obstructive pulmonary disease) ([place])   . Diabetes mellitus without complication ([place])   . Hypertension   . Seasonal allergies   . Sleep apnea with use of continuous positive airway pressure (CPAP)    uses CPAP at night    Past Surgical History:  Procedure Laterality Date  . APPENDECTOMY    . CRANIOTOMY Left 12/30/2015   Procedure: BURR HOLE HEMATOMA EVACUATION SUBDURAL;  Surgeon: Kary Kos, MD;  Location: [facility];  Service: Neurosurgery;  Laterality: Left;  . TONSILLECTOMY       SOCIAL HISTORY:  Social History   Socioeconomic History  . Marital status: Married    Spouse name: Not on file  . Number of children: Not on file  . Years of education: Not on file  . Highest education level: Not on file  Occupational History  . Not on file  Tobacco Use  . Smoking status: Never Smoker  . Smokeless tobacco: Never Used  Substance and Sexual Activity  . Alcohol use: No  . Drug use: No  . Sexual activity: Not  on file  Other Topics Concern  . Not on file  Social History Narrative  . Not on file   Social Determinants of Health   Financial Resource Strain:   . Difficulty of Paying Living Expenses: Not on file  Food Insecurity:   . Worried About Charity fundraiser in the Last Year: Not on file  . Ran Out of Food in the Last Year: Not on file  Transportation Needs:   . Lack of Transportation (Medical): Not on file  . Lack of Transportation (Non-Medical): Not on file  Physical Activity:   . Days of Exercise per Week: Not on file  . Minutes of Exercise per Session: Not on file  Stress:   . Feeling of Stress : Not on file  Social Connections:   . Frequency of Communication with Friends and Family: Not on file  . Frequency of Social Gatherings with Friends and Family: Not on file  . Attends Religious Services: Not on file  . Active Member of Clubs or Organizations: Not on file  . Attends Archivist Meetings: Not on file  . Marital Status: Not on file  Intimate Partner Violence:   . Fear of Current or Ex-Partner: Not on file  . Emotionally Abused: Not on file  . Physically Abused: Not on file  . Sexually Abused: Not on file  FAMILY HISTORY:  No family history on file.  CURRENT MEDICATIONS:  Outpatient Encounter Medications as of 12/05/2019  Medication Sig  . amLODipine (NORVASC) 10 MG tablet Take 10 mg by mouth daily.   Marland Kitchen atorvastatin (LIPITOR) 20 MG tablet Take 20 mg by mouth daily.  . cetirizine (ZYRTEC) 10 MG tablet Take 10 mg by mouth daily as needed for allergies.   . fluticasone (FLONASE) 50 MCG/ACT nasal spray Place 2 sprays into both nostrils daily.   . insulin glargine (LANTUS) 100 UNIT/ML injection Inject 25-30 Units into the skin every morning.   . irbesartan-hydrochlorothiazide (AVALIDE) 300-12.5 MG tablet Take 1 tablet by mouth daily.  . metFORMIN (GLUCOPHAGE) 1000 MG tablet Take 1,000 mg by mouth 2 (two) times daily with a meal.   . tamsulosin (FLOMAX) 0.4  MG CAPS capsule Take 0.4 mg by mouth at bedtime.    No facility-administered encounter medications on file as of 12/05/2019.    ALLERGIES:  Allergies  Allergen Reactions  . Penicillins     Has patient had a PCN reaction causing immediate rash, facial/tongue/throat swelling, SOB or lightheadedness with hypotension: unknown Has patient had a PCN reaction causing severe rash involving mucus membranes or skin necrosis: unknown Has patient had a PCN reaction that required hospitalization: unknown Has patient had a PCN reaction occurring within the last 10 years: No If all of the above answers are "NO", then may proceed with  . Sulfa Antibiotics   . Oxycodone Nausea And Vomiting     PHYSICAL EXAM:  ECOG Performance status: 1  Vitals:   12/05/19 1100  BP: (!) 159/86  Pulse: 70  Resp: 20  Temp: 97.8 F (36.6 C)  SpO2: 98%   Filed Weights   12/05/19 1100  Weight: 222 lb 3.2 oz (100.8 kg)    Physical Exam Vitals reviewed.  Constitutional:      Appearance: Normal appearance.  Cardiovascular:     Rate and Rhythm: Normal rate and regular rhythm.     Heart sounds: Normal heart sounds.  Pulmonary:     Effort: Pulmonary effort is normal.     Breath sounds: Normal breath sounds.  Abdominal:     General: There is no distension.     Palpations: Abdomen is soft. There is no mass.  Musculoskeletal:        General: No swelling.  Skin:    General: Skin is warm.  Neurological:     General: No focal deficit present.     Mental Status: He is alert and oriented to person, place, and time.  Psychiatric:        Mood and Affect: Mood normal.        Behavior: Behavior normal.      LABORATORY DATA:  I have reviewed the labs as listed.  CBC    Component Value Date/Time   WBC 21.8 (H) 12/03/2019 1006   RBC 4.95 12/03/2019 1006   HGB 13.7 12/03/2019 1006   HCT 43.8 12/03/2019 1006   PLT 246 12/03/2019 1006   MCV 88.5 12/03/2019 1006   MCH 27.7 12/03/2019 1006   MCHC 31.3  12/03/2019 1006   RDW 13.6 12/03/2019 1006   LYMPHSABS 15.7 (H) 12/03/2019 1006   MONOABS 0.7 12/03/2019 1006   EOSABS 0.2 12/03/2019 1006   BASOSABS 0.1 12/03/2019 1006   CMP Latest Ref Rng & Units 12/03/2019 12/03/2019 07/12/2019  Glucose 70 - 99 mg/dL 153(H) - 236(H)  BUN 8 - 23 mg/dL 11 - 13  Creatinine 0.61 - 1.24  mg/dL 0.81 0.80 1.04  Sodium 135 - 145 mmol/L 139 - 141  Potassium 3.5 - 5.1 mmol/L 3.6 - 3.7  Chloride 98 - 111 mmol/L 102 - 103  CO2 22 - 32 mmol/L 27 - 27  Calcium 8.9 - 10.3 mg/dL 8.9 - 9.3  Total Protein 6.5 - 8.1 g/dL 7.5 - 7.6  Total Bilirubin 0.3 - 1.2 mg/dL 0.6 - 0.6  Alkaline Phos 38 - 126 U/L 74 - 90  AST 15 - 41 U/L 40 - 32  ALT 0 - 44 U/L 44 - 35       DIAGNOSTIC IMAGING:  I have independently reviewed the scans and discussed with the patient.   I have reviewed Venita Lick LPN's note and agree with the documentation.  I personally performed a face-to-face visit, made revisions and my assessment and plan is as follows.    ASSESSMENT & PLAN:   Melanoma of skin ([place]) 1.  Stage IIIa (pT2a N1AM0) malignant melanoma: - Resection on 10/15/2018 showing superficial spreading melanoma, 1.2 mm, uninvolved peripheral margins, deep margin uninvolved but close, mitotic index 2/millimeters SQ, pathological staging pT2a, no lymphovascular invasion, no neurotropism - Wide excision and sentinel lymph node mapping on 11/23/2018 showing 1 sentinel lymph node with microscopic melanoma 0.1 mm, no residual tumor. - Lymph node dissection on 12/14/2018 shows 28 lymph nodes negative, 9 sentinel lymph nodes negative. -Foundation 1 testing shows BRAF and KIT mutation negative.  MS-stable.  TMB-15 Muts/Mb.  NRAS Q61K positive. -He was evaluated by Dr. Harriet Masson at [facility] who recommended against immunotherapy. - In stage IIIa disease, risk of disease recurrence is less than 20% and therefore observation is an option.  Patient is agreeable to this. - CT scan on 07/12/2019 showed mild  right infrahilar, gastrocolic ligament and upper retroperitoneal lymphadenopathy stable since April 2020.  Scattered subcentimeter solid pulmonary nodules are all stable.  No new findings. -We reviewed CT scans dated 12/03/2019.  This showed stable mild right infrahilar, gastro colic ligament, upper retroperitoneal adenopathy. -We also reviewed his labs.  LDH is within normal limits.  LFTs are normal.  I plan to see him back in 6 months for follow-up with repeat scans.  2.  CLL: - Diagnosed by flow cytometry on 10/01/2018, CD5 positive, CD10 negative and CD20 positive population. -No B symptoms.  White count slightly increased to 21.8.  No palpable adenopathy. -We will continue to monitor at 14-monthintervals.   Orders placed this encounter:  Orders Placed This Encounter  Procedures  . CT Chest W Contrast  . CT Abdomen Pelvis W Contrast  . CBC with Differential/Platelet  . Comprehensive metabolic panel  . Lactate dehydrogenase      SDerek Jack MD [phone]

## 2019-12-27 ENCOUNTER — Other Ambulatory Visit (HOSPITAL_COMMUNITY): Payer: Self-pay | Admitting: Nurse Practitioner

## 2019-12-28 DIAGNOSIS — G4733 Obstructive sleep apnea (adult) (pediatric): Secondary | ICD-10-CM | POA: Diagnosis not present

## 2020-01-06 DIAGNOSIS — Z85828 Personal history of other malignant neoplasm of skin: Secondary | ICD-10-CM | POA: Diagnosis not present

## 2020-01-06 DIAGNOSIS — Z08 Encounter for follow-up examination after completed treatment for malignant neoplasm: Secondary | ICD-10-CM | POA: Diagnosis not present

## 2020-01-06 DIAGNOSIS — D225 Melanocytic nevi of trunk: Secondary | ICD-10-CM | POA: Diagnosis not present

## 2020-01-06 DIAGNOSIS — D485 Neoplasm of uncertain behavior of skin: Secondary | ICD-10-CM | POA: Diagnosis not present

## 2020-01-07 DIAGNOSIS — R809 Proteinuria, unspecified: Secondary | ICD-10-CM | POA: Diagnosis not present

## 2020-01-07 DIAGNOSIS — L603 Nail dystrophy: Secondary | ICD-10-CM | POA: Diagnosis not present

## 2020-01-07 DIAGNOSIS — C771 Secondary and unspecified malignant neoplasm of intrathoracic lymph nodes: Secondary | ICD-10-CM | POA: Diagnosis not present

## 2020-01-07 DIAGNOSIS — Z6836 Body mass index (BMI) 36.0-36.9, adult: Secondary | ICD-10-CM | POA: Diagnosis not present

## 2020-01-07 DIAGNOSIS — Z6835 Body mass index (BMI) 35.0-35.9, adult: Secondary | ICD-10-CM | POA: Diagnosis not present

## 2020-01-07 DIAGNOSIS — C4359 Malignant melanoma of other part of trunk: Secondary | ICD-10-CM | POA: Diagnosis not present

## 2020-01-07 DIAGNOSIS — Z6833 Body mass index (BMI) 33.0-33.9, adult: Secondary | ICD-10-CM | POA: Diagnosis not present

## 2020-01-07 DIAGNOSIS — R32 Unspecified urinary incontinence: Secondary | ICD-10-CM | POA: Diagnosis not present

## 2020-01-07 DIAGNOSIS — C9110 Chronic lymphocytic leukemia of B-cell type not having achieved remission: Secondary | ICD-10-CM | POA: Diagnosis not present

## 2020-01-07 DIAGNOSIS — Z0000 Encounter for general adult medical examination without abnormal findings: Secondary | ICD-10-CM | POA: Diagnosis not present

## 2020-01-07 DIAGNOSIS — E1165 Type 2 diabetes mellitus with hyperglycemia: Secondary | ICD-10-CM | POA: Diagnosis not present

## 2020-01-07 DIAGNOSIS — Z6831 Body mass index (BMI) 31.0-31.9, adult: Secondary | ICD-10-CM | POA: Diagnosis not present

## 2020-01-07 DIAGNOSIS — L309 Dermatitis, unspecified: Secondary | ICD-10-CM | POA: Diagnosis not present

## 2020-01-09 ENCOUNTER — Ambulatory Visit (HOSPITAL_COMMUNITY): Payer: Medicare PPO | Admitting: Hematology

## 2020-01-09 DIAGNOSIS — C4359 Malignant melanoma of other part of trunk: Secondary | ICD-10-CM | POA: Diagnosis not present

## 2020-01-09 DIAGNOSIS — I10 Essential (primary) hypertension: Secondary | ICD-10-CM | POA: Diagnosis not present

## 2020-01-09 DIAGNOSIS — N400 Enlarged prostate without lower urinary tract symptoms: Secondary | ICD-10-CM | POA: Diagnosis not present

## 2020-01-09 DIAGNOSIS — C9110 Chronic lymphocytic leukemia of B-cell type not having achieved remission: Secondary | ICD-10-CM | POA: Diagnosis not present

## 2020-01-09 DIAGNOSIS — E1165 Type 2 diabetes mellitus with hyperglycemia: Secondary | ICD-10-CM | POA: Diagnosis not present

## 2020-01-09 DIAGNOSIS — D72829 Elevated white blood cell count, unspecified: Secondary | ICD-10-CM | POA: Diagnosis not present

## 2020-01-09 DIAGNOSIS — E782 Mixed hyperlipidemia: Secondary | ICD-10-CM | POA: Diagnosis not present

## 2020-01-09 DIAGNOSIS — R972 Elevated prostate specific antigen [PSA]: Secondary | ICD-10-CM | POA: Diagnosis not present

## 2020-01-25 DIAGNOSIS — G4733 Obstructive sleep apnea (adult) (pediatric): Secondary | ICD-10-CM | POA: Diagnosis not present

## 2020-02-24 DIAGNOSIS — Z08 Encounter for follow-up examination after completed treatment for malignant neoplasm: Secondary | ICD-10-CM | POA: Diagnosis not present

## 2020-02-24 DIAGNOSIS — I872 Venous insufficiency (chronic) (peripheral): Secondary | ICD-10-CM | POA: Diagnosis not present

## 2020-02-24 DIAGNOSIS — D225 Melanocytic nevi of trunk: Secondary | ICD-10-CM | POA: Diagnosis not present

## 2020-02-24 DIAGNOSIS — Z85828 Personal history of other malignant neoplasm of skin: Secondary | ICD-10-CM | POA: Diagnosis not present

## 2020-02-24 DIAGNOSIS — Z1283 Encounter for screening for malignant neoplasm of skin: Secondary | ICD-10-CM | POA: Diagnosis not present

## 2020-02-24 DIAGNOSIS — C44319 Basal cell carcinoma of skin of other parts of face: Secondary | ICD-10-CM | POA: Diagnosis not present

## 2020-02-24 DIAGNOSIS — L570 Actinic keratosis: Secondary | ICD-10-CM | POA: Diagnosis not present

## 2020-02-24 DIAGNOSIS — C4441 Basal cell carcinoma of skin of scalp and neck: Secondary | ICD-10-CM | POA: Diagnosis not present

## 2020-02-24 DIAGNOSIS — X32XXXD Exposure to sunlight, subsequent encounter: Secondary | ICD-10-CM | POA: Diagnosis not present

## 2020-02-25 DIAGNOSIS — G4733 Obstructive sleep apnea (adult) (pediatric): Secondary | ICD-10-CM | POA: Diagnosis not present

## 2020-03-26 DIAGNOSIS — H43811 Vitreous degeneration, right eye: Secondary | ICD-10-CM | POA: Diagnosis not present

## 2020-03-26 DIAGNOSIS — G4733 Obstructive sleep apnea (adult) (pediatric): Secondary | ICD-10-CM | POA: Diagnosis not present

## 2020-03-30 DIAGNOSIS — Z85820 Personal history of malignant melanoma of skin: Secondary | ICD-10-CM | POA: Diagnosis not present

## 2020-03-30 DIAGNOSIS — L570 Actinic keratosis: Secondary | ICD-10-CM | POA: Diagnosis not present

## 2020-03-30 DIAGNOSIS — Z08 Encounter for follow-up examination after completed treatment for malignant neoplasm: Secondary | ICD-10-CM | POA: Diagnosis not present

## 2020-03-30 DIAGNOSIS — Z85828 Personal history of other malignant neoplasm of skin: Secondary | ICD-10-CM | POA: Diagnosis not present

## 2020-03-30 DIAGNOSIS — X32XXXD Exposure to sunlight, subsequent encounter: Secondary | ICD-10-CM | POA: Diagnosis not present

## 2020-03-30 DIAGNOSIS — C4441 Basal cell carcinoma of skin of scalp and neck: Secondary | ICD-10-CM | POA: Diagnosis not present

## 2020-04-22 DIAGNOSIS — H3581 Retinal edema: Secondary | ICD-10-CM | POA: Diagnosis not present

## 2020-04-26 DIAGNOSIS — G4733 Obstructive sleep apnea (adult) (pediatric): Secondary | ICD-10-CM | POA: Diagnosis not present

## 2020-05-08 DIAGNOSIS — E119 Type 2 diabetes mellitus without complications: Secondary | ICD-10-CM | POA: Diagnosis not present

## 2020-05-08 DIAGNOSIS — E1165 Type 2 diabetes mellitus with hyperglycemia: Secondary | ICD-10-CM | POA: Diagnosis not present

## 2020-05-08 DIAGNOSIS — C4359 Malignant melanoma of other part of trunk: Secondary | ICD-10-CM | POA: Diagnosis not present

## 2020-05-08 DIAGNOSIS — C771 Secondary and unspecified malignant neoplasm of intrathoracic lymph nodes: Secondary | ICD-10-CM | POA: Diagnosis not present

## 2020-05-08 DIAGNOSIS — E6609 Other obesity due to excess calories: Secondary | ICD-10-CM | POA: Diagnosis not present

## 2020-05-08 DIAGNOSIS — Z0000 Encounter for general adult medical examination without abnormal findings: Secondary | ICD-10-CM | POA: Diagnosis not present

## 2020-05-08 DIAGNOSIS — C9110 Chronic lymphocytic leukemia of B-cell type not having achieved remission: Secondary | ICD-10-CM | POA: Diagnosis not present

## 2020-05-08 DIAGNOSIS — D72829 Elevated white blood cell count, unspecified: Secondary | ICD-10-CM | POA: Diagnosis not present

## 2020-05-14 DIAGNOSIS — E1165 Type 2 diabetes mellitus with hyperglycemia: Secondary | ICD-10-CM | POA: Diagnosis not present

## 2020-05-14 DIAGNOSIS — C9110 Chronic lymphocytic leukemia of B-cell type not having achieved remission: Secondary | ICD-10-CM | POA: Diagnosis not present

## 2020-05-14 DIAGNOSIS — C4359 Malignant melanoma of other part of trunk: Secondary | ICD-10-CM | POA: Diagnosis not present

## 2020-05-14 DIAGNOSIS — D72829 Elevated white blood cell count, unspecified: Secondary | ICD-10-CM | POA: Diagnosis not present

## 2020-05-14 DIAGNOSIS — I10 Essential (primary) hypertension: Secondary | ICD-10-CM | POA: Diagnosis not present

## 2020-05-14 DIAGNOSIS — E782 Mixed hyperlipidemia: Secondary | ICD-10-CM | POA: Diagnosis not present

## 2020-05-14 DIAGNOSIS — N400 Enlarged prostate without lower urinary tract symptoms: Secondary | ICD-10-CM | POA: Diagnosis not present

## 2020-05-14 DIAGNOSIS — R972 Elevated prostate specific antigen [PSA]: Secondary | ICD-10-CM | POA: Diagnosis not present

## 2020-05-19 DIAGNOSIS — H35371 Puckering of macula, right eye: Secondary | ICD-10-CM | POA: Diagnosis not present

## 2020-05-19 DIAGNOSIS — H43811 Vitreous degeneration, right eye: Secondary | ICD-10-CM | POA: Diagnosis not present

## 2020-05-19 DIAGNOSIS — H2513 Age-related nuclear cataract, bilateral: Secondary | ICD-10-CM | POA: Diagnosis not present

## 2020-05-20 ENCOUNTER — Ambulatory Visit: Payer: Medicare PPO | Admitting: Family Medicine

## 2020-05-21 DIAGNOSIS — Z1283 Encounter for screening for malignant neoplasm of skin: Secondary | ICD-10-CM | POA: Diagnosis not present

## 2020-05-21 DIAGNOSIS — X32XXXD Exposure to sunlight, subsequent encounter: Secondary | ICD-10-CM | POA: Diagnosis not present

## 2020-05-21 DIAGNOSIS — Z08 Encounter for follow-up examination after completed treatment for malignant neoplasm: Secondary | ICD-10-CM | POA: Diagnosis not present

## 2020-05-21 DIAGNOSIS — L821 Other seborrheic keratosis: Secondary | ICD-10-CM | POA: Diagnosis not present

## 2020-05-21 DIAGNOSIS — L570 Actinic keratosis: Secondary | ICD-10-CM | POA: Diagnosis not present

## 2020-05-21 DIAGNOSIS — Z85820 Personal history of malignant melanoma of skin: Secondary | ICD-10-CM | POA: Diagnosis not present

## 2020-05-26 DIAGNOSIS — G4733 Obstructive sleep apnea (adult) (pediatric): Secondary | ICD-10-CM | POA: Diagnosis not present

## 2020-05-29 ENCOUNTER — Other Ambulatory Visit: Payer: Self-pay

## 2020-05-29 ENCOUNTER — Inpatient Hospital Stay (HOSPITAL_COMMUNITY): Payer: Medicare PPO | Attending: Hematology

## 2020-05-29 ENCOUNTER — Ambulatory Visit (HOSPITAL_COMMUNITY)
Admission: RE | Admit: 2020-05-29 | Discharge: 2020-05-29 | Disposition: A | Discharge status: Home / Self Care | Payer: Medicare PPO | Source: Ambulatory Visit | Attending: Hematology | Admitting: Hematology

## 2020-05-29 DIAGNOSIS — C4359 Malignant melanoma of other part of trunk: Secondary | ICD-10-CM | POA: Diagnosis not present

## 2020-05-29 DIAGNOSIS — C439 Malignant melanoma of skin, unspecified: Secondary | ICD-10-CM | POA: Insufficient documentation

## 2020-05-29 DIAGNOSIS — N281 Cyst of kidney, acquired: Secondary | ICD-10-CM | POA: Diagnosis not present

## 2020-05-29 DIAGNOSIS — C9110 Chronic lymphocytic leukemia of B-cell type not having achieved remission: Secondary | ICD-10-CM | POA: Diagnosis not present

## 2020-05-29 DIAGNOSIS — M47816 Spondylosis without myelopathy or radiculopathy, lumbar region: Secondary | ICD-10-CM | POA: Diagnosis not present

## 2020-05-29 DIAGNOSIS — I700 Atherosclerosis of aorta: Secondary | ICD-10-CM | POA: Diagnosis not present

## 2020-05-29 DIAGNOSIS — D1809 Hemangioma of other sites: Secondary | ICD-10-CM | POA: Diagnosis not present

## 2020-05-29 DIAGNOSIS — Z79899 Other long term (current) drug therapy: Secondary | ICD-10-CM | POA: Insufficient documentation

## 2020-05-29 LAB — COMPREHENSIVE METABOLIC PANEL
ALT: 13 U/L (ref 0–44)
AST: 14 U/L — ABNORMAL LOW (ref 15–41)
Albumin: 4.1 g/dL (ref 3.5–5.0)
Alkaline Phosphatase: 77 U/L (ref 38–126)
Anion gap: 10 (ref 5–15)
BUN: 14 mg/dL (ref 8–23)
CO2: 28 mmol/L (ref 22–32)
Calcium: 8.9 mg/dL (ref 8.9–10.3)
Chloride: 102 mmol/L (ref 98–111)
Creatinine, Ser: 1 mg/dL (ref 0.61–1.24)
GFR calc Af Amer: >60 mL/min (ref >60)
GFR calc non Af Amer: >60 mL/min (ref >60)
Glucose, Bld: 118 mg/dL — ABNORMAL HIGH (ref 70–99)
Potassium: 3.8 mmol/L (ref 3.5–5.1)
Sodium: 140 mmol/L (ref 135–145)
Total Bilirubin: 0.9 mg/dL (ref 0.3–1.2)
Total Protein: 7.2 g/dL (ref 6.5–8.1)

## 2020-05-29 LAB — CBC WITH DIFFERENTIAL/PLATELET
Abs Immature Granulocytes: 0.02 10*3/uL (ref 0.00–0.07)
Basophils Absolute: 0 10*3/uL (ref 0.0–0.1)
Basophils Relative: 0 %
Eosinophils Absolute: 0.2 10*3/uL (ref 0.0–0.5)
Eosinophils Relative: 1 %
HCT: 40.1 % (ref 39.0–52.0)
Hemoglobin: 12.4 g/dL — ABNORMAL LOW (ref 13.0–17.0)
Immature Granulocytes: 0 %
Lymphocytes Relative: 66 %
Lymphs Abs: 9 10*3/uL — ABNORMAL HIGH (ref 0.7–4.0)
MCH: 27.5 pg (ref 26.0–34.0)
MCHC: 30.9 g/dL (ref 30.0–36.0)
MCV: 88.9 fL (ref 80.0–100.0)
Monocytes Absolute: 0.8 10*3/uL (ref 0.1–1.0)
Monocytes Relative: 6 %
Neutro Abs: 3.7 10*3/uL (ref 1.7–7.7)
Neutrophils Relative %: 27 %
Platelets: 211 10*3/uL (ref 150–400)
RBC: 4.51 MIL/uL (ref 4.22–5.81)
RDW: 14.4 % (ref 11.5–15.5)
WBC: 13.7 10*3/uL — ABNORMAL HIGH (ref 4.0–10.5)
nRBC: 0 % (ref 0.0–0.2)

## 2020-05-29 LAB — LACTATE DEHYDROGENASE: LDH: 119 U/L (ref 98–192)

## 2020-05-29 MED ORDER — IOHEXOL 300 MG/ML  SOLN
100.0000 mL | Freq: Once | INTRAMUSCULAR | Status: AC | PRN
  Administered 2020-05-29: 100 mL via INTRAVENOUS

## 2020-06-04 ENCOUNTER — Other Ambulatory Visit (HOSPITAL_COMMUNITY): Payer: Self-pay | Admitting: Hematology

## 2020-06-04 ENCOUNTER — Other Ambulatory Visit: Payer: Self-pay

## 2020-06-04 ENCOUNTER — Inpatient Hospital Stay (HOSPITAL_COMMUNITY): Payer: Medicare PPO | Admitting: Hematology

## 2020-06-04 VITALS — BP 144/64 | HR 53 | Temp 97.7°F | Resp 18 | Wt 204.0 lb

## 2020-06-04 DIAGNOSIS — C9110 Chronic lymphocytic leukemia of B-cell type not having achieved remission: Secondary | ICD-10-CM | POA: Diagnosis not present

## 2020-06-04 DIAGNOSIS — C439 Malignant melanoma of skin, unspecified: Secondary | ICD-10-CM | POA: Diagnosis not present

## 2020-06-04 DIAGNOSIS — Z79899 Other long term (current) drug therapy: Secondary | ICD-10-CM | POA: Diagnosis not present

## 2020-06-04 DIAGNOSIS — N281 Cyst of kidney, acquired: Secondary | ICD-10-CM | POA: Diagnosis not present

## 2020-06-04 DIAGNOSIS — C4359 Malignant melanoma of other part of trunk: Secondary | ICD-10-CM | POA: Diagnosis not present

## 2020-06-04 NOTE — Progress Notes (Signed)
[facility] [address] [place] [address]   CLINIC:  Medical Oncology/Hematology  PCP:  Mark Squibb, MD [address] L[place] [address]  [phone]  REASON FOR VISIT:  Follow-up for CLL and malignant melanoma  PRIOR THERAPY:  1. Melanoma resection on 10/15/2018 2. Wide excision and sentinel lymph node mapping on 11/23/2018  NGS Status: Foundation 1 BRAF and KIT mutation negative  CURRENT THERAPY: Observation  INTERVAL HISTORY:  [name], a 69 y.o. male, returns for routine follow-up for his CLL and malignant melanoma. Mark Huang was last seen on 12/05/2019.  Today he reports feeling well since his last visit. He denies any infections, F/C, though he reports he has lost weight intentionally; he drinks lots of water and cut down on sugar intake. He denies having any hematuria. He denies seeing any more moles or rashes. He reports having swelling in his legs, but he elevates his legs twice daily. He has a history of kidney stones over 20 years ago, but he does not remember the constitution of the stones.  He got the second COVID vaccine in 12/30/2019. He is scheduled for surgery on 06/14/2020 to remove scar tissue behind his right eye.   REVIEW OF SYSTEMS:  Review of Systems  Constitutional: Positive for fatigue. Negative for appetite change, chills and fever.  Genitourinary: Negative for hematuria.   Skin: Negative for rash.  All other systems reviewed and are negative.   PAST MEDICAL/SURGICAL HISTORY:  Past Medical History:  Diagnosis Date  . BPH (benign prostatic hyperplasia)   . Cancer ([facility])   . COPD (chronic obstructive pulmonary disease) ([place])   . Diabetes mellitus without complication ([facility])   . Hypertension   . Seasonal allergies   . Sleep apnea with use of continuous positive airway pressure (CPAP)    uses CPAP at night    Past Surgical History:  Procedure Laterality Date  . APPENDECTOMY    . CRANIOTOMY Left  12/30/2015   Procedure: BURR HOLE HEMATOMA EVACUATION SUBDURAL;  Surgeon: Kary Kos, MD;  Location: [facility];  Service: Neurosurgery;  Laterality: Left;  . TONSILLECTOMY      SOCIAL HISTORY:  Social History   Socioeconomic History  . Marital status: Married    Spouse name: Not on file  . Number of children: Not on file  . Years of education: Not on file  . Highest education level: Not on file  Occupational History  . Not on file  Tobacco Use  . Smoking status: Never Smoker  . Smokeless tobacco: Never Used  Substance and Sexual Activity  . Alcohol use: No  . Drug use: No  . Sexual activity: Not on file  Other Topics Concern  . Not on file  Social History Narrative  . Not on file   Social Determinants of Health   Financial Resource Strain:   . Difficulty of Paying Living Expenses:   Food Insecurity:   . Worried About Charity fundraiser in the Last Year:   . Arboriculturist in the Last Year:   Transportation Needs:   . Film/video editor (Medical):   Marland Kitchen Lack of Transportation (Non-Medical):   Physical Activity:   . Days of Exercise per Week:   . Minutes of Exercise per Session:   Stress:   . Feeling of Stress :   Social Connections:   . Frequency of Communication with Friends and Family:   . Frequency of Social  Gatherings with Friends and Family:   . Attends Religious Services:   . Active Member of Clubs or Organizations:   . Attends Archivist Meetings:   Marland Kitchen Marital Status:   Intimate Partner Violence:   . Fear of Current or Ex-Partner:   . Emotionally Abused:   Marland Kitchen Physically Abused:   . Sexually Abused:     FAMILY HISTORY:  No family history on file.  CURRENT MEDICATIONS:  Current Outpatient Medications  Medication Sig Dispense Refill  . amLODipine (NORVASC) 10 MG tablet Take 10 mg by mouth daily.     Marland Kitchen atorvastatin (LIPITOR) 20 MG tablet Take 20 mg by mouth daily.    . fluticasone (FLONASE) 50 MCG/ACT nasal spray Place 2 sprays into both  nostrils daily.     . insulin glargine (LANTUS) 100 UNIT/ML injection Inject 25-30 Units into the skin every morning.     . irbesartan-hydrochlorothiazide (AVALIDE) 300-12.5 MG tablet Take 1 tablet by mouth daily.    . metFORMIN (GLUCOPHAGE) 1000 MG tablet Take 1,000 mg by mouth 2 (two) times daily with a meal.     . tamsulosin (FLOMAX) 0.4 MG CAPS capsule Take 0.4 mg by mouth at bedtime.     . cetirizine (ZYRTEC) 10 MG tablet Take 10 mg by mouth daily as needed for allergies.  (Patient not taking: Reported on 06/04/2020)     No current facility-administered medications for this visit.    ALLERGIES:  Allergies  Allergen Reactions  . Penicillins     Has patient had a PCN reaction causing immediate rash, facial/tongue/throat swelling, SOB or lightheadedness with hypotension: unknown Has patient had a PCN reaction causing severe rash involving mucus membranes or skin necrosis: unknown Has patient had a PCN reaction that required hospitalization: unknown Has patient had a PCN reaction occurring within the last 10 years: No If all of the above answers are "NO", then may proceed with  . Sulfa Antibiotics   . Oxycodone Nausea And Vomiting    PHYSICAL EXAM:  Performance status (ECOG): 1 - Symptomatic but completely ambulatory  Vitals:   06/04/20 1123  BP: (!) 144/64  Pulse: 53  Resp: 18  Temp: 97.7 F (36.5 C)  SpO2: 98%   Wt Readings from Last 3 Encounters:  06/04/20 (!) 204 lb (92.5 kg)  12/05/19 222 lb 3.2 oz (100.8 kg)  07/18/19 218 lb 12.8 oz (99.2 kg)   Physical Exam Vitals reviewed.  Constitutional:      Appearance: Normal appearance.  Cardiovascular:     Rate and Rhythm: Normal rate and regular rhythm.     Pulses: Normal pulses.     Heart sounds: Normal heart sounds.  Pulmonary:     Effort: Pulmonary effort is normal.     Breath sounds: Normal breath sounds.  Abdominal:     Palpations: Abdomen is soft. There is no hepatomegaly, splenomegaly or mass.     Tenderness:  There is no abdominal tenderness.  Musculoskeletal:     Right lower leg: Edema (1+) present.     Left lower leg: Edema (1+) present.  Lymphadenopathy:     Cervical: No cervical adenopathy.     Upper Body:     Right upper body: No supraclavicular or axillary adenopathy.     Left upper body: No supraclavicular or axillary adenopathy.     Lower Body: No right inguinal adenopathy. No left inguinal adenopathy.  Neurological:     General: No focal deficit present.     Mental Status: He is alert  and oriented to person, place, and time.  Psychiatric:        Mood and Affect: Mood normal.        Behavior: Behavior normal.     LABORATORY DATA:  I have reviewed the labs as listed.  CBC Latest Ref Rng & Units 05/29/2020 12/03/2019 07/12/2019  WBC 4.0 - 10.5 K/uL 13.7(H) 21.8(H) 19.1(H)  Hemoglobin 13.0 - 17.0 g/dL 12.4(L) 13.7 13.8  Hematocrit 39 - 52 % 40.1 43.8 43.8  Platelets 150 - 400 K/uL 211 246 226   CMP Latest Ref Rng & Units 05/29/2020 12/03/2019 12/03/2019  Glucose 70 - 99 mg/dL 118(H) 153(H) -  BUN 8 - 23 mg/dL 14 11 -  Creatinine 0.61 - 1.24 mg/dL 1.00 0.81 0.80  Sodium 135 - 145 mmol/L 140 139 -  Potassium 3.5 - 5.1 mmol/L 3.8 3.6 -  Chloride 98 - 111 mmol/L 102 102 -  CO2 22 - 32 mmol/L 28 27 -  Calcium 8.9 - 10.3 mg/dL 8.9 8.9 -  Total Protein 6.5 - 8.1 g/dL 7.2 7.5 -  Total Bilirubin 0.3 - 1.2 mg/dL 0.9 0.6 -  Alkaline Phos 38 - 126 U/L 77 74 -  AST 15 - 41 U/L 14(L) 40 -  ALT 0 - 44 U/L 13 44 -      Component Value Date/Time   RBC 4.51 05/29/2020 0939   MCV 88.9 05/29/2020 0939   MCH 27.5 05/29/2020 0939   MCHC 30.9 05/29/2020 0939   RDW 14.4 05/29/2020 0939   LYMPHSABS 9.0 (H) 05/29/2020 0939   MONOABS 0.8 05/29/2020 0939   EOSABS 0.2 05/29/2020 0939   BASOSABS 0.0 05/29/2020 0939   Lab Results  Component Value Date   LDH 119 05/29/2020   LDH 161 12/03/2019   LDH 146 07/12/2019     DIAGNOSTIC IMAGING:  I have independently reviewed the scans and discussed  with the patient. CT Chest W Contrast  Result Date: 05/29/2020 CLINICAL DATA:  Restaging of right chest wall melanoma. Additional history of chronic lymphocytic leukemia restaging. EXAM: CT CHEST, ABDOMEN, AND PELVIS WITH CONTRAST TECHNIQUE: Multidetector CT imaging of the chest, abdomen and pelvis was performed following the standard protocol during bolus administration of intravenous contrast. CONTRAST:  168m OMNIPAQUE IOHEXOL 300 MG/ML  SOLN COMPARISON:  12/03/2019 FINDINGS: CT CHEST FINDINGS Cardiovascular: Atherosclerotic calcification of the left main, left anterior descending, right, and circumflex coronary arteries with mild thoracic aortic atherosclerotic calcification. Mild cardiomegaly. Mediastinum/Nodes: Right infrahilar node 1.0 cm in short axis on image 33/2, previously the same. 1.0 by 0.8 cm hypodense left thyroid lobe nodule on image 11/2, no change. Not clinically significant; no follow-up imaging recommended (ref: J Am Coll Radiol. 2015 Feb;12(2): 143-50). Lungs/Pleura: 2 mm right middle lobe nodule on image 85/3 common no change from 10/19/2018. Similar 2 by 3 mm right middle lobe nodule on image 92/3, no change from 2019. Other small nodules in the right middle lobe are likewise unchanged. 2 by 3 mm right upper lobe nodule on image 38/3, no change from 2019. 2 mm left lower lobe nodule on image 117/3 common no change from 2019. Nodularity along the left hemidiaphragm on images 103-105 of series 3, no change from 2019. Overall these pulmonary nodules are chronically stable and most likely benign. Musculoskeletal: Thoracic spondylosis. T10 hemangioma eccentric to the left. CT ABDOMEN PELVIS FINDINGS Hepatobiliary: Hypodense lesion in the dome of segment 2 measures 1.5 by 0.9 by 0.7 cm on image 49/2, mostly stable since 10/19/2018. A second hypodense  nodule in segment 2 measures 1.6 by 1.2 by 1.7 cm and more clearly has fluid density compatible with a cyst. This lesion is also stable from  10/19/2018. The gallbladder is unremarkable. No biliary dilatation. No new or worrisome liver lesion. Pancreas: Unremarkable Spleen: The spleen measures 13.2 by 6.7 by 11.4 cm (volume = 530 cm^3), compatible with mild splenomegaly. No focal splenic lesion. Adrenals/Urinary Tract: The prostate gland indents the bladder base. No hydronephrosis or hydroureter. Stable left renal cysts. 0.6 cm right mid to lower kidney hypodense lesion is likely a cyst but technically too small to characterize. A left mid kidney lesion measuring 1.1 by 1.1 by 0.8 cm is technically nonspecific (image 26/8), essentially stable from 12/03/2019, but has increased conspicuity compared to 07/12/2019 and earlier exams in my opinion. Stomach/Bowel: There are a few scattered sigmoid colon diverticula. Otherwise unremarkable. Vascular/Lymphatic: Aortoiliac atherosclerotic vascular disease. Portacaval node 1.4 cm in short axis on image 65/2, previously the same by my measurements. Peripancreatic node 0.9 cm in short axis on image 62/2, previously 1.1 cm by my measurements. Clustered left periaortic lymph nodes include a 0.8 cm node on image 77/2 which previously measured 0.9 cm. Left inguinal node with a fatty hilum measures 1.3 cm in short axis on image 128/3, previously the same a presumed lymph node just above the transverse colon measures 1.1 cm in short axis on image 64/2, previously the same on 12/03/2019 and previously 1.2 cm on 10/19/2018. Reproductive: The prostate gland measures 5.7 by 5.4 by 6.2 cm (volume = 100 cm^3), compatible with considerable prostatomegaly. Other: No supplemental non-categorized findings. Musculoskeletal: Lumbar spondylosis and degenerative disc disease noted with fused left L4-5 facet joint. This results in multilevel impingement. IMPRESSION: 1. Stable mild adenopathy in the chest, abdomen, and pelvis. 2. Stable (from 2019) small pulmonary nodules, most likely benign. 3. Mild splenomegaly. 4. There is a 1.1 by 1.1  by 0.8 cm left mid kidney lesion, technically nonspecific. This is stable from 12/03/2019, but has increased conspicuity compared to 07/12/2019 and earlier exams in my opinion. Surveillance of this lesion is suggested. If the patient has hematuria or if more aggressive workup is warranted, renal protocol MRI with and without contrast could be utilized. 5. Other imaging findings of potential clinical significance: Coronary atherosclerosis. Mild cardiomegaly. Stable hepatic and left renal cysts. Lumbar spondylosis and degenerative disc disease causing multilevel impingement. Prostatomegaly. 6. Aortic atherosclerosis. Aortic Atherosclerosis (ICD10-I70.0). Electronically Signed   By: Van Clines M.D.   On: 05/29/2020 15:27   CT Abdomen Pelvis W Contrast  Result Date: 05/29/2020 CLINICAL DATA:  Restaging of right chest wall melanoma. Additional history of chronic lymphocytic leukemia restaging. EXAM: CT CHEST, ABDOMEN, AND PELVIS WITH CONTRAST TECHNIQUE: Multidetector CT imaging of the chest, abdomen and pelvis was performed following the standard protocol during bolus administration of intravenous contrast. CONTRAST:  177m OMNIPAQUE IOHEXOL 300 MG/ML  SOLN COMPARISON:  12/03/2019 FINDINGS: CT CHEST FINDINGS Cardiovascular: Atherosclerotic calcification of the left main, left anterior descending, right, and circumflex coronary arteries with mild thoracic aortic atherosclerotic calcification. Mild cardiomegaly. Mediastinum/Nodes: Right infrahilar node 1.0 cm in short axis on image 33/2, previously the same. 1.0 by 0.8 cm hypodense left thyroid lobe nodule on image 11/2, no change. Not clinically significant; no follow-up imaging recommended (ref: J Am Coll Radiol. 2015 Feb;12(2): 143-50). Lungs/Pleura: 2 mm right middle lobe nodule on image 85/3 common no change from 10/19/2018. Similar 2 by 3 mm right middle lobe nodule on image 92/3, no change from 2019. Other  small nodules in the right middle lobe are  likewise unchanged. 2 by 3 mm right upper lobe nodule on image 38/3, no change from 2019. 2 mm left lower lobe nodule on image 117/3 common no change from 2019. Nodularity along the left hemidiaphragm on images 103-105 of series 3, no change from 2019. Overall these pulmonary nodules are chronically stable and most likely benign. Musculoskeletal: Thoracic spondylosis. T10 hemangioma eccentric to the left. CT ABDOMEN PELVIS FINDINGS Hepatobiliary: Hypodense lesion in the dome of segment 2 measures 1.5 by 0.9 by 0.7 cm on image 49/2, mostly stable since 10/19/2018. A second hypodense nodule in segment 2 measures 1.6 by 1.2 by 1.7 cm and more clearly has fluid density compatible with a cyst. This lesion is also stable from 10/19/2018. The gallbladder is unremarkable. No biliary dilatation. No new or worrisome liver lesion. Pancreas: Unremarkable Spleen: The spleen measures 13.2 by 6.7 by 11.4 cm (volume = 530 cm^3), compatible with mild splenomegaly. No focal splenic lesion. Adrenals/Urinary Tract: The prostate gland indents the bladder base. No hydronephrosis or hydroureter. Stable left renal cysts. 0.6 cm right mid to lower kidney hypodense lesion is likely a cyst but technically too small to characterize. A left mid kidney lesion measuring 1.1 by 1.1 by 0.8 cm is technically nonspecific (image 26/8), essentially stable from 12/03/2019, but has increased conspicuity compared to 07/12/2019 and earlier exams in my opinion. Stomach/Bowel: There are a few scattered sigmoid colon diverticula. Otherwise unremarkable. Vascular/Lymphatic: Aortoiliac atherosclerotic vascular disease. Portacaval node 1.4 cm in short axis on image 65/2, previously the same by my measurements. Peripancreatic node 0.9 cm in short axis on image 62/2, previously 1.1 cm by my measurements. Clustered left periaortic lymph nodes include a 0.8 cm node on image 77/2 which previously measured 0.9 cm. Left inguinal node with a fatty hilum measures 1.3 cm  in short axis on image 128/3, previously the same a presumed lymph node just above the transverse colon measures 1.1 cm in short axis on image 64/2, previously the same on 12/03/2019 and previously 1.2 cm on 10/19/2018. Reproductive: The prostate gland measures 5.7 by 5.4 by 6.2 cm (volume = 100 cm^3), compatible with considerable prostatomegaly. Other: No supplemental non-categorized findings. Musculoskeletal: Lumbar spondylosis and degenerative disc disease noted with fused left L4-5 facet joint. This results in multilevel impingement. IMPRESSION: 1. Stable mild adenopathy in the chest, abdomen, and pelvis. 2. Stable (from 2019) small pulmonary nodules, most likely benign. 3. Mild splenomegaly. 4. There is a 1.1 by 1.1 by 0.8 cm left mid kidney lesion, technically nonspecific. This is stable from 12/03/2019, but has increased conspicuity compared to 07/12/2019 and earlier exams in my opinion. Surveillance of this lesion is suggested. If the patient has hematuria or if more aggressive workup is warranted, renal protocol MRI with and without contrast could be utilized. 5. Other imaging findings of potential clinical significance: Coronary atherosclerosis. Mild cardiomegaly. Stable hepatic and left renal cysts. Lumbar spondylosis and degenerative disc disease causing multilevel impingement. Prostatomegaly. 6. Aortic atherosclerosis. Aortic Atherosclerosis (ICD10-I70.0). Electronically Signed   By: Van Clines M.D.   On: 05/29/2020 15:27     ASSESSMENT:  1.  Stage IIIa (pT2a N1AM0) malignant melanoma: - Resection on 10/15/2018 showing superficial spreading melanoma, 1.2 mm, uninvolved peripheral margins, deep margin uninvolved but close, mitotic index 2/millimeters SQ, pathological staging pT2a, no lymphovascular invasion, no neurotropism - Wide excision and sentinel lymph node mapping on 11/23/2018 showing 1 sentinel lymph node with microscopic melanoma 0.1 mm, no residual tumor. -  Lymph node dissection  on 12/14/2018 shows 28 lymph nodes negative, 9 sentinel lymph nodes negative. -Foundation 1 testing shows BRAF and KIT mutation negative.  MS-stable.  TMB-15 Muts/Mb.  NRAS Q61K positive. -He was evaluated by Dr. Harriet Masson at [facility] who recommended against immunotherapy. - In stage IIIa disease, risk of disease recurrence is less than 20% and therefore observation is an option.  Patient is agreeable to this. - CT scan on 07/12/2019 showed mild right infrahilar, gastrocolic ligament and upper retroperitoneal lymphadenopathy stable since April 2020.  Scattered subcentimeter solid pulmonary nodules are all stable.  No new findings. -CT CAP on 05/29/2020 shows stable mild adenopathy in the chest, abdomen and pelvis.  Stable small pulmonary nodules from 2019, most likely benign.  Mild splenomegaly.  1.1 x 1.1 x 0.8 cm left mid kidney lesion, technically nonspecific, stable from 12/03/2019 but has increased conspicuity compared to 07/12/2019.  2.  CLL: -Diagnosed by flow cytometry 10/01/2018, CD5 positive, CD10 negative and CD20 positive population. -No B symptoms.   PLAN:  1.  Stage IIIa (pT2a N1AM0) malignant melanoma: -He does not have any recurrence symptoms or signs at this time. -I reviewed the results of the CT scan with the patient along with lab work.  LDH was normal. -I plan to repeat another scan in 6 months with repeat labs and physical exam.  2.  CLL: -CBC shows white count 13.7 which is improved from last CBC with 21.8.  Hemoglobin and platelets are normal.  No B symptoms. -No palpable adenopathy. -No indication for treatment at this time.  We will reevaluate in 6 months.  3.  Left kidney lesion: -There is a 1.1 x 1.1 x 0.8 cm left mid kidney lesion on the CT scan. -He does not have any hematuria at this time.  Will consider aggressive work-up if it is symptomatic.  Otherwise we will follow on subsequent scans.   Orders placed this encounter:  No orders of the defined types were placed  in this encounter.    Derek Jack, MD [place] [phone]   I, Milinda Antis, am acting as a scribe for Dr. Sanda Linger.  I, Derek Jack MD, have reviewed the above documentation for accuracy and completeness, and I agree with the above.

## 2020-06-04 NOTE — Patient Instructions (Addendum)
[place] at [facility] Discharge Instructions  You were seen today by Dr. Delton Coombes. He went over your recent results. You may proceed with your surgery. You will be scheduled for a CT scan of your chest and abdomen. Dr. Delton Coombes will see you back in 6 months for labs and follow up.   Thank you for choosing [place] at [facility] to provide your oncology and hematology care.  To afford each patient quality time with our provider, please arrive at least 15 minutes before your scheduled appointment time.   If you have a lab appointment with the [facility] please come in thru the Main Entrance and check in at the main information desk  You need to re-schedule your appointment should you arrive 10 or more minutes late.  We strive to give you quality time with our providers, and arriving late affects you and other patients whose appointments are after yours.  Also, if you no show three or more times for appointments you may be dismissed from the clinic at the providers discretion.     Again, thank you for choosing [facility].  Our hope is that these requests will decrease the amount of time that you wait before being seen by our physicians.       _____________________________________________________________  Should you have questions after your visit to [facility], please contact our office at [phone] between the hours of 8:00 a.m. and 4:30 p.m.  Voicemails left after 4:00 p.m. will not be returned until the following business day.  For prescription refill requests, have your pharmacy contact our office and allow 72 hours.    Cancer Center Support Programs:   > Cancer Support Group  2nd Tuesday of the month 1pm-2pm, Journey Room

## 2020-06-13 DIAGNOSIS — G4733 Obstructive sleep apnea (adult) (pediatric): Secondary | ICD-10-CM | POA: Diagnosis not present

## 2020-06-26 DIAGNOSIS — G4733 Obstructive sleep apnea (adult) (pediatric): Secondary | ICD-10-CM | POA: Diagnosis not present

## 2020-07-13 DIAGNOSIS — H35371 Puckering of macula, right eye: Secondary | ICD-10-CM | POA: Diagnosis not present

## 2020-07-27 DIAGNOSIS — G4733 Obstructive sleep apnea (adult) (pediatric): Secondary | ICD-10-CM | POA: Diagnosis not present

## 2020-07-28 DIAGNOSIS — H35371 Puckering of macula, right eye: Secondary | ICD-10-CM | POA: Diagnosis not present

## 2020-08-26 DIAGNOSIS — G4733 Obstructive sleep apnea (adult) (pediatric): Secondary | ICD-10-CM | POA: Diagnosis not present

## 2020-09-08 DIAGNOSIS — H35371 Puckering of macula, right eye: Secondary | ICD-10-CM | POA: Diagnosis not present

## 2020-09-26 DIAGNOSIS — G4733 Obstructive sleep apnea (adult) (pediatric): Secondary | ICD-10-CM | POA: Diagnosis not present

## 2020-10-26 DIAGNOSIS — G4733 Obstructive sleep apnea (adult) (pediatric): Secondary | ICD-10-CM | POA: Diagnosis not present

## 2020-10-29 DIAGNOSIS — Z85820 Personal history of malignant melanoma of skin: Secondary | ICD-10-CM | POA: Diagnosis not present

## 2020-10-29 DIAGNOSIS — Z08 Encounter for follow-up examination after completed treatment for malignant neoplasm: Secondary | ICD-10-CM | POA: Diagnosis not present

## 2020-10-29 DIAGNOSIS — C44399 Other specified malignant neoplasm of skin of other parts of face: Secondary | ICD-10-CM | POA: Diagnosis not present

## 2020-10-29 DIAGNOSIS — C4A39 Merkel cell carcinoma of other parts of face: Secondary | ICD-10-CM | POA: Diagnosis not present

## 2020-10-29 DIAGNOSIS — X32XXXD Exposure to sunlight, subsequent encounter: Secondary | ICD-10-CM | POA: Diagnosis not present

## 2020-10-29 DIAGNOSIS — Z1283 Encounter for screening for malignant neoplasm of skin: Secondary | ICD-10-CM | POA: Diagnosis not present

## 2020-10-29 DIAGNOSIS — C44319 Basal cell carcinoma of skin of other parts of face: Secondary | ICD-10-CM | POA: Diagnosis not present

## 2020-10-29 DIAGNOSIS — D225 Melanocytic nevi of trunk: Secondary | ICD-10-CM | POA: Diagnosis not present

## 2020-10-29 DIAGNOSIS — L570 Actinic keratosis: Secondary | ICD-10-CM | POA: Diagnosis not present

## 2020-11-15 DIAGNOSIS — C4A39 Merkel cell carcinoma of other parts of face: Secondary | ICD-10-CM | POA: Diagnosis not present

## 2020-11-18 DIAGNOSIS — C4A30 Merkel cell carcinoma of unspecified part of face: Secondary | ICD-10-CM | POA: Diagnosis not present

## 2020-11-24 DIAGNOSIS — I10 Essential (primary) hypertension: Secondary | ICD-10-CM | POA: Diagnosis not present

## 2020-11-24 DIAGNOSIS — Z794 Long term (current) use of insulin: Secondary | ICD-10-CM | POA: Diagnosis not present

## 2020-11-24 DIAGNOSIS — Z01818 Encounter for other preprocedural examination: Secondary | ICD-10-CM | POA: Diagnosis not present

## 2020-11-24 DIAGNOSIS — E1169 Type 2 diabetes mellitus with other specified complication: Secondary | ICD-10-CM | POA: Diagnosis not present

## 2020-11-24 DIAGNOSIS — L989 Disorder of the skin and subcutaneous tissue, unspecified: Secondary | ICD-10-CM | POA: Diagnosis not present

## 2020-11-26 DIAGNOSIS — G4733 Obstructive sleep apnea (adult) (pediatric): Secondary | ICD-10-CM | POA: Diagnosis not present

## 2020-11-27 DIAGNOSIS — C4A9 Merkel cell carcinoma, unspecified: Secondary | ICD-10-CM | POA: Diagnosis not present

## 2020-11-27 DIAGNOSIS — M25421 Effusion, right elbow: Secondary | ICD-10-CM | POA: Diagnosis not present

## 2020-11-27 DIAGNOSIS — Z880 Allergy status to penicillin: Secondary | ICD-10-CM | POA: Diagnosis not present

## 2020-11-27 DIAGNOSIS — C4A39 Merkel cell carcinoma of other parts of face: Secondary | ICD-10-CM | POA: Diagnosis not present

## 2020-11-27 DIAGNOSIS — Z882 Allergy status to sulfonamides status: Secondary | ICD-10-CM | POA: Diagnosis not present

## 2020-11-27 DIAGNOSIS — C4A30 Merkel cell carcinoma of unspecified part of face: Secondary | ICD-10-CM | POA: Diagnosis not present

## 2020-11-27 DIAGNOSIS — S52124A Nondisplaced fracture of head of right radius, initial encounter for closed fracture: Secondary | ICD-10-CM | POA: Diagnosis not present

## 2020-11-27 DIAGNOSIS — Z7984 Long term (current) use of oral hypoglycemic drugs: Secondary | ICD-10-CM | POA: Diagnosis not present

## 2020-11-27 DIAGNOSIS — I10 Essential (primary) hypertension: Secondary | ICD-10-CM | POA: Diagnosis not present

## 2020-11-27 DIAGNOSIS — Z01818 Encounter for other preprocedural examination: Secondary | ICD-10-CM | POA: Diagnosis not present

## 2020-11-27 DIAGNOSIS — E119 Type 2 diabetes mellitus without complications: Secondary | ICD-10-CM | POA: Diagnosis not present

## 2020-12-07 ENCOUNTER — Inpatient Hospital Stay (HOSPITAL_COMMUNITY): Payer: Medicare PPO

## 2020-12-07 ENCOUNTER — Ambulatory Visit (HOSPITAL_COMMUNITY): Payer: Medicare PPO

## 2020-12-10 ENCOUNTER — Ambulatory Visit (HOSPITAL_COMMUNITY): Payer: Medicare PPO | Admitting: Hematology

## 2020-12-11 DIAGNOSIS — Z09 Encounter for follow-up examination after completed treatment for conditions other than malignant neoplasm: Secondary | ICD-10-CM | POA: Diagnosis not present

## 2020-12-11 DIAGNOSIS — C4A30 Merkel cell carcinoma of unspecified part of face: Secondary | ICD-10-CM | POA: Diagnosis not present

## 2020-12-21 DIAGNOSIS — Z856 Personal history of leukemia: Secondary | ICD-10-CM | POA: Diagnosis not present

## 2020-12-21 DIAGNOSIS — Z85820 Personal history of malignant melanoma of skin: Secondary | ICD-10-CM | POA: Diagnosis not present

## 2020-12-21 DIAGNOSIS — C4A39 Merkel cell carcinoma of other parts of face: Secondary | ICD-10-CM | POA: Diagnosis not present

## 2020-12-21 DIAGNOSIS — C4A30 Merkel cell carcinoma of unspecified part of face: Secondary | ICD-10-CM | POA: Diagnosis not present

## 2020-12-27 DIAGNOSIS — G4733 Obstructive sleep apnea (adult) (pediatric): Secondary | ICD-10-CM | POA: Diagnosis not present

## 2020-12-29 DIAGNOSIS — H35371 Puckering of macula, right eye: Secondary | ICD-10-CM | POA: Diagnosis not present

## 2020-12-29 DIAGNOSIS — H2513 Age-related nuclear cataract, bilateral: Secondary | ICD-10-CM | POA: Diagnosis not present

## 2020-12-29 DIAGNOSIS — E119 Type 2 diabetes mellitus without complications: Secondary | ICD-10-CM | POA: Diagnosis not present

## 2020-12-30 ENCOUNTER — Other Ambulatory Visit: Payer: Self-pay

## 2020-12-30 ENCOUNTER — Ambulatory Visit (HOSPITAL_COMMUNITY)
Admission: RE | Admit: 2020-12-30 | Discharge: 2020-12-30 | Disposition: A | Discharge status: Home / Self Care | Payer: Medicare PPO | Source: Ambulatory Visit | Attending: Hematology | Admitting: Hematology

## 2020-12-30 ENCOUNTER — Inpatient Hospital Stay (HOSPITAL_COMMUNITY): Payer: Medicare PPO | Attending: Hematology

## 2020-12-30 DIAGNOSIS — R590 Localized enlarged lymph nodes: Secondary | ICD-10-CM | POA: Insufficient documentation

## 2020-12-30 DIAGNOSIS — K760 Fatty (change of) liver, not elsewhere classified: Secondary | ICD-10-CM | POA: Insufficient documentation

## 2020-12-30 DIAGNOSIS — E041 Nontoxic single thyroid nodule: Secondary | ICD-10-CM | POA: Insufficient documentation

## 2020-12-30 DIAGNOSIS — C9110 Chronic lymphocytic leukemia of B-cell type not having achieved remission: Secondary | ICD-10-CM | POA: Diagnosis not present

## 2020-12-30 DIAGNOSIS — J449 Chronic obstructive pulmonary disease, unspecified: Secondary | ICD-10-CM | POA: Insufficient documentation

## 2020-12-30 DIAGNOSIS — C4321 Malignant melanoma of right ear and external auricular canal: Secondary | ICD-10-CM | POA: Diagnosis not present

## 2020-12-30 DIAGNOSIS — R162 Hepatomegaly with splenomegaly, not elsewhere classified: Secondary | ICD-10-CM | POA: Diagnosis not present

## 2020-12-30 DIAGNOSIS — K7689 Other specified diseases of liver: Secondary | ICD-10-CM | POA: Diagnosis not present

## 2020-12-30 DIAGNOSIS — N400 Enlarged prostate without lower urinary tract symptoms: Secondary | ICD-10-CM | POA: Diagnosis not present

## 2020-12-30 DIAGNOSIS — Z794 Long term (current) use of insulin: Secondary | ICD-10-CM | POA: Insufficient documentation

## 2020-12-30 DIAGNOSIS — E119 Type 2 diabetes mellitus without complications: Secondary | ICD-10-CM | POA: Diagnosis not present

## 2020-12-30 DIAGNOSIS — I10 Essential (primary) hypertension: Secondary | ICD-10-CM | POA: Diagnosis not present

## 2020-12-30 DIAGNOSIS — G4730 Sleep apnea, unspecified: Secondary | ICD-10-CM | POA: Diagnosis not present

## 2020-12-30 DIAGNOSIS — Z8709 Personal history of other diseases of the respiratory system: Secondary | ICD-10-CM | POA: Diagnosis not present

## 2020-12-30 DIAGNOSIS — Z85820 Personal history of malignant melanoma of skin: Secondary | ICD-10-CM | POA: Diagnosis not present

## 2020-12-30 DIAGNOSIS — Z79899 Other long term (current) drug therapy: Secondary | ICD-10-CM | POA: Insufficient documentation

## 2020-12-30 LAB — COMPREHENSIVE METABOLIC PANEL
ALT: 21 U/L (ref 0–44)
AST: 22 U/L (ref 15–41)
Albumin: 4.1 g/dL (ref 3.5–5.0)
Alkaline Phosphatase: 80 U/L (ref 38–126)
Anion gap: 8 (ref 5–15)
BUN: 11 mg/dL (ref 8–23)
CO2: 26 mmol/L (ref 22–32)
Calcium: 8.9 mg/dL (ref 8.9–10.3)
Chloride: 103 mmol/L (ref 98–111)
Creatinine, Ser: 0.9 mg/dL (ref 0.61–1.24)
GFR, Estimated: >60 mL/min (ref >60)
Glucose, Bld: 117 mg/dL — ABNORMAL HIGH (ref 70–99)
Potassium: 3.5 mmol/L (ref 3.5–5.1)
Sodium: 137 mmol/L (ref 135–145)
Total Bilirubin: 0.6 mg/dL (ref 0.3–1.2)
Total Protein: 7.4 g/dL (ref 6.5–8.1)

## 2020-12-30 LAB — CBC
HCT: 42.1 % (ref 39.0–52.0)
Hemoglobin: 13.1 g/dL (ref 13.0–17.0)
MCH: 27.3 pg (ref 26.0–34.0)
MCHC: 31.1 g/dL (ref 30.0–36.0)
MCV: 87.9 fL (ref 80.0–100.0)
Platelets: 238 10*3/uL (ref 150–400)
RBC: 4.79 MIL/uL (ref 4.22–5.81)
RDW: 14.2 % (ref 11.5–15.5)
WBC: 18.2 10*3/uL — ABNORMAL HIGH (ref 4.0–10.5)
nRBC: 0 % (ref 0.0–0.2)

## 2020-12-30 LAB — LACTATE DEHYDROGENASE: LDH: 162 U/L (ref 98–192)

## 2020-12-30 MED ORDER — IOHEXOL 300 MG/ML  SOLN
100.0000 mL | Freq: Once | INTRAMUSCULAR | Status: AC | PRN
  Administered 2020-12-30: 100 mL via INTRAVENOUS

## 2021-01-01 DIAGNOSIS — Z85820 Personal history of malignant melanoma of skin: Secondary | ICD-10-CM | POA: Diagnosis not present

## 2021-01-01 DIAGNOSIS — Z856 Personal history of leukemia: Secondary | ICD-10-CM | POA: Diagnosis not present

## 2021-01-01 DIAGNOSIS — C4A39 Merkel cell carcinoma of other parts of face: Secondary | ICD-10-CM | POA: Diagnosis not present

## 2021-01-06 ENCOUNTER — Other Ambulatory Visit: Payer: Self-pay

## 2021-01-06 ENCOUNTER — Inpatient Hospital Stay (HOSPITAL_COMMUNITY): Payer: Medicare PPO | Admitting: Hematology

## 2021-01-06 VITALS — BP 148/61 | HR 63 | Temp 98.1°F | Resp 18 | Wt 218.9 lb

## 2021-01-06 DIAGNOSIS — K760 Fatty (change of) liver, not elsewhere classified: Secondary | ICD-10-CM | POA: Diagnosis not present

## 2021-01-06 DIAGNOSIS — C9110 Chronic lymphocytic leukemia of B-cell type not having achieved remission: Secondary | ICD-10-CM

## 2021-01-06 DIAGNOSIS — E041 Nontoxic single thyroid nodule: Secondary | ICD-10-CM | POA: Diagnosis not present

## 2021-01-06 DIAGNOSIS — R590 Localized enlarged lymph nodes: Secondary | ICD-10-CM | POA: Diagnosis not present

## 2021-01-06 DIAGNOSIS — E119 Type 2 diabetes mellitus without complications: Secondary | ICD-10-CM | POA: Diagnosis not present

## 2021-01-06 DIAGNOSIS — I10 Essential (primary) hypertension: Secondary | ICD-10-CM | POA: Diagnosis not present

## 2021-01-06 DIAGNOSIS — J449 Chronic obstructive pulmonary disease, unspecified: Secondary | ICD-10-CM | POA: Diagnosis not present

## 2021-01-06 DIAGNOSIS — C4321 Malignant melanoma of right ear and external auricular canal: Secondary | ICD-10-CM | POA: Diagnosis not present

## 2021-01-06 DIAGNOSIS — C439 Malignant melanoma of skin, unspecified: Secondary | ICD-10-CM | POA: Diagnosis not present

## 2021-01-06 DIAGNOSIS — C4A30 Merkel cell carcinoma of unspecified part of face: Secondary | ICD-10-CM

## 2021-01-06 DIAGNOSIS — R162 Hepatomegaly with splenomegaly, not elsewhere classified: Secondary | ICD-10-CM | POA: Diagnosis not present

## 2021-01-06 NOTE — Progress Notes (Signed)
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 Melanoma resection on 10/15/2018. 2. Wide excision and sentinel lymph node mapping on 11/23/2018. 3. Merkel cell carcinoma tumor excision on 11/27/2020.  CURRENT THERAPY: Observation  INTERVAL HISTORY:  [name], a 70 y.o. 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 Dustine was last seen on 06/04/2020.  Today he reports feeling okay. He had excision of his tumor on 01/14 at [facility] by Dr. Andi Devon. He is slated to start radiation in [place] on 03/07 for 6 weeks.  He moved to [place] at the beginning of January 2022.   REVIEW OF SYSTEMS:  Review of Systems  Constitutional: Positive for fatigue (75%). Negative for appetite change.  Cardiovascular: Positive for leg swelling.  Psychiatric/Behavioral: The patient is nervous/anxious.   All other systems reviewed and are negative.   PAST MEDICAL/SURGICAL HISTORY:  Past Medical History:  Diagnosis Date  . BPH (benign prostatic hyperplasia)   . Cancer ([place])   . COPD (chronic obstructive pulmonary disease) ([place])   . Diabetes mellitus without complication ([place])   . Hypertension   . Seasonal allergies   . Sleep apnea with use of continuous positive airway pressure (CPAP)    uses CPAP at night    Past Surgical History:  Procedure Laterality Date  . APPENDECTOMY    . CRANIOTOMY Left 12/30/2015   Procedure: BURR HOLE HEMATOMA EVACUATION SUBDURAL;  Surgeon: Kary Kos, MD;  Location: [facility];  Service: Neurosurgery;  Laterality: Left;  . TONSILLECTOMY      SOCIAL HISTORY:  Social History   Socioeconomic  History  . Marital status: Married    Spouse name: Not on file  . Number of children: Not on file  . Years of education: Not on file  . Highest education level: Not on file  Occupational History  . Not on file  Tobacco Use  . Smoking status: Never Smoker  . Smokeless tobacco: Never Used  Substance and Sexual Activity  . Alcohol use: No  . Drug use: No  . Sexual activity: Not on file  Other Topics Concern  . Not on file  Social History Narrative  . Not on file   Social Determinants of Health   Financial Resource Strain: Not on file  Food Insecurity: Not on file  Transportation Needs: No Transportation Needs  . Lack of Transportation (Medical): No  . Lack of Transportation (Non-Medical): No  Physical Activity: Inactive  . Days of Exercise per Week: 0 days  . Minutes of Exercise per Session: 0 min  Stress: Not on file  Social Connections: Not on file  Intimate Partner Violence: Not At Risk  . Fear of Current or Ex-Partner: No  . Emotionally Abused: No  . Physically Abused: No  . Sexually Abused: No    FAMILY HISTORY:  No family history on file.  CURRENT MEDICATIONS:  Current Outpatient Medications  Medication Sig Dispense Refill  . acetaminophen (TYLENOL) 500 MG tablet Take by mouth.    Marland Kitchen amLODipine (NORVASC) 10 MG tablet Take 10 mg by mouth daily.     Marland Kitchen atorvastatin (LIPITOR) 20 MG tablet  Take 20 mg by mouth daily.    . cetirizine (ZYRTEC) 10 MG tablet Take 10 mg by mouth daily as needed for allergies.    . fluticasone (FLONASE) 50 MCG/ACT nasal spray Place 2 sprays into both nostrils daily.     . insulin glargine (LANTUS) 100 UNIT/ML injection Inject 25-30 Units into the skin every morning.     . irbesartan-hydrochlorothiazide (AVALIDE) 300-12.5 MG tablet Take 1 tablet by mouth daily.    Marland Kitchen loperamide (IMODIUM A-D) 2 MG tablet Take by mouth.    . metFORMIN (GLUCOPHAGE) 1000 MG tablet Take 1,000 mg by mouth 2 (two) times daily with a meal.     . tamsulosin (FLOMAX)  0.4 MG CAPS capsule Take 0.4 mg by mouth at bedtime.      No current facility-administered medications for this visit.    ALLERGIES:  Allergies  Allergen Reactions  . Penicillins     Has patient had a PCN reaction causing immediate rash, facial/tongue/throat swelling, SOB or lightheadedness with hypotension: unknown Has patient had a PCN reaction causing severe rash involving mucus membranes or skin necrosis: unknown Has patient had a PCN reaction that required hospitalization: unknown Has patient had a PCN reaction occurring within the last 10 years: No If all of the above answers are "NO", then may proceed with  . Sulfa Antibiotics   . Oxycodone Nausea And Vomiting    PHYSICAL EXAM:  Performance status (ECOG): 1 - Symptomatic but completely ambulatory  Vitals:   01/06/21 1513  BP: (!) 148/61  Pulse: 63  Resp: 18  Temp: 98.1 F (36.7 C)  SpO2: 99%   Wt Readings from Last 3 Encounters:  01/06/21 218 lb 14.4 oz (99.3 kg)  06/04/20 (!) 204 lb (92.5 kg)  12/05/19 222 lb 3.2 oz (100.8 kg)   Physical Exam Vitals reviewed.  Constitutional:      Appearance: Normal appearance.  HENT:     Head:   Cardiovascular:     Rate and Rhythm: Normal rate and regular rhythm.     Pulses: Normal pulses.     Heart sounds: Normal heart sounds.  Pulmonary:     Effort: Pulmonary effort is normal.     Breath sounds: Normal breath sounds.  Chest:  Breasts:     Right: No supraclavicular adenopathy.     Left: No supraclavicular adenopathy.    Musculoskeletal:     Right lower leg: Edema (1+) present.     Left lower leg: Edema (1+) present.  Lymphadenopathy:     Cervical: No cervical adenopathy.     Upper Body:     Right upper body: No supraclavicular adenopathy.     Left upper body: No supraclavicular adenopathy.  Neurological:     General: No focal deficit present.     Mental Status: He is alert and oriented to person, place, and time.  Psychiatric:        Mood and Affect: Mood  normal.        Behavior: Behavior normal.     LABORATORY DATA:  I have reviewed the labs as listed.  CBC Latest Ref Rng & Units 12/30/2020 05/29/2020 12/03/2019  WBC 4.0 - 10.5 K/uL 18.2(H) 13.7(H) 21.8(H)  Hemoglobin 13.0 - 17.0 g/dL 49.6 12.4(L) 13.7  Hematocrit 39.0 - 52.0 % 42.1 40.1 43.8  Platelets 150 - 400 K/uL 238 211 246   CMP Latest Ref Rng & Units 12/30/2020 05/29/2020 12/03/2019  Glucose 70 - 99 mg/dL 116(I) 353(P) 122(Z)  BUN 8 - 23 mg/dL  $'11 14 11  'v$ Creatinine 0.61 - 1.24 mg/dL 0.90 1.00 0.81  Sodium 135 - 145 mmol/L 137 140 139  Potassium 3.5 - 5.1 mmol/L 3.5 3.8 3.6  Chloride 98 - 111 mmol/L 103 102 102  CO2 22 - 32 mmol/L $RemoveB'26 28 27  'VRnYNslD$ Calcium 8.9 - 10.3 mg/dL 8.9 8.9 8.9  Total Protein 6.5 - 8.1 g/dL 7.4 7.2 7.5  Total Bilirubin 0.3 - 1.2 mg/dL 0.6 0.9 0.6  Alkaline Phos 38 - 126 U/L 80 77 74  AST 15 - 41 U/L 22 14(L) 40  ALT 0 - 44 U/L 21 13 44      Component Value Date/Time   RBC 4.79 12/30/2020 1430   MCV 87.9 12/30/2020 1430   MCH 27.3 12/30/2020 1430   MCHC 31.1 12/30/2020 1430   RDW 14.2 12/30/2020 1430   LYMPHSABS 9.0 (H) 05/29/2020 0939   MONOABS 0.8 05/29/2020 0939   EOSABS 0.2 05/29/2020 0939   BASOSABS 0.0 05/29/2020 0939   Lab Results  Component Value Date   LDH 162 12/30/2020   LDH 119 05/29/2020   LDH 161 12/03/2019    DIAGNOSTIC IMAGING:  I have independently reviewed the scans and discussed with the patient. CT CHEST ABDOMEN PELVIS W CONTRAST  Result Date: 12/31/2020 CLINICAL DATA:  Malignant melanoma with resection 12/19, below right ear. Chronic lymphocytic leukemia. Merkel cell. Hypertension. Diabetes. COPD. Chemotherapy planning. EXAM: CT CHEST, ABDOMEN, AND PELVIS WITH CONTRAST TECHNIQUE: Multidetector CT imaging of the chest, abdomen and pelvis was performed following the standard protocol during bolus administration of intravenous contrast. CONTRAST:  164mL OMNIPAQUE IOHEXOL 300 MG/ML  SOLN COMPARISON:  05/29/2020 FINDINGS: CT CHEST  FINDINGS Cardiovascular: Aortic atherosclerosis. Tortuous thoracic aorta. Mild cardiomegaly, without pericardial effusion. Multivessel coronary artery atherosclerosis. No central pulmonary embolism, on this non-dedicated study. Mediastinum/Nodes: Left-sided thyroid nodule of 8 mm is similar. Not clinically significant; no follow-up imaging recommended (ref: J Am Coll Radiol. 2015 Feb;12(2): 143-50). No supraclavicular adenopathy. Right axillary node dissection. No axillary adenopathy. No mediastinal or hilar adenopathy. Lungs/Pleura: No pleural fluid. Scattered bilateral pulmonary nodules are similar. Examples in the left lung base on images 104 and 105 at up to 6 mm. No new, enlarging, or suspicious pulmonary nodules. Musculoskeletal: No acute osseous abnormality. CT ABDOMEN PELVIS FINDINGS Hepatobiliary: Hepatic steatosis. Segment 2 hepatic cysts. Hepatomegaly 22.1 cm craniocaudal. Similar nonspecific caudate lobe enlargement. Normal gallbladder, without biliary ductal dilatation. Pancreas: Normal, without mass or ductal dilatation. Spleen: Splenic size of 13.4 x 8.4 by 12.0 cm (volume = 710 cm^3). No focal lesion. Adrenals/Urinary Tract: Normal adrenal glands. Bilateral too small to characterize renal lesions. An exophytic 5.0 cm lower pole left renal cyst anteriorly. The indeterminate lesion on the prior exam, within the interpolar left kidney, is similar at 1.0 cm on 20/1/8. No hydronephrosis. Decompressed urinary bladder. Stomach/Bowel: Normal stomach, without wall thickening. Extensive colonic diverticulosis. Normal terminal ileum. Normal small bowel. Vascular/Lymphatic: Aortic atherosclerosis. Multiple small left periaortic abdominal retroperitoneal nodes, maximally 7 mm on 82/2 versus 6 mm on the prior. Portocaval node measures 1.4 cm on 70/2 and is similar to on the prior exam (when remeasured). Index gastrohepatic ligament node measures 11 mm on 65/2 and is unchanged. A node in the transverse mesocolon  measures 11 mm on 67/2, similar. Prominent bilateral inguinal nodes which are not pathologic by size criteria. Reproductive: Moderate prostatomegaly. Other: No significant free fluid. Musculoskeletal: No acute osseous abnormality. IMPRESSION: 1. No significant change or evidence of progressive disease compared to 05/29/2020. 2. Similar bilateral  pulmonary nodules, consistent with a benign etiology. 3. Hepatic steatosis and hepatomegaly. Chronic caudate lobe enlargement for which mild cirrhosis cannot be excluded. 4. Similar abdominal adenopathy. This could be related to the patient's history of chronic lymphocytic leukemia or be reactive in the setting of liver disease. 5. Similar mild splenomegaly. 6. Coronary artery atherosclerosis. Aortic Atherosclerosis (ICD10-I70.0). 7. Prostatomegaly. 8. The previously described lesion of interest in the left kidney is unchanged in size, remaining indeterminate. Electronically Signed   By: Jeronimo Greaves M.D.   On: 12/31/2020 11:40     ASSESSMENT:  1. Stage IIIa (pT2a N1AM0) malignant melanoma: -Resection on 10/15/2018 showing superficial spreading melanoma, 1.2 mm, uninvolved peripheral margins, deep margin uninvolved but close, mitotic index 2/millimeters SQ, pathological staging pT2a, no lymphovascular invasion, no neurotropism -Wide excision and sentinel lymph node mapping on 11/23/2018 showing 1 sentinel lymph node with microscopic melanoma 0.1 mm, no residual tumor. -Lymph node dissection on 12/14/2018 shows 28 lymph nodes negative, 9 sentinel lymph nodes negative. -Foundation 1 testing showsBRAF and KIT mutation negative. MS-stable. TMB-15 Muts/Mb. NRAS Q61K positive. -He was evaluated by Dr. Dan Europe at [facility] who recommended against immunotherapy. -In stage IIIa disease, risk of disease recurrence is less than 20% and therefore observation is an option. Patient is agreeable to this. -CT scan on 07/12/2019 showed mild right infrahilar, gastrocolic  ligament and upper retroperitoneal lymphadenopathy stable since April 2020. Scattered subcentimeter solid pulmonary nodules are all stable. No new findings. -CT CAP on 05/29/2020 shows stable mild adenopathy in the chest, abdomen and pelvis.  Stable small pulmonary nodules from 2019, most likely benign.  Mild splenomegaly.  1.1 x 1.1 x 0.8 cm left mid kidney lesion, technically nonspecific, stable from 12/03/2019 but has increased conspicuity compared to 07/12/2019.  2. CLL: -Diagnosed by flow cytometry 10/01/2018, CD5 positive, CD10 negative and CD20 positive population. -No B symptoms.  3.  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 -Pathology showed 0.9 cm Merkel cell carcinoma, thickness 6.8 mm, lymphatic invasion-brisk, 51 mitosis per millimeter square, 0/3 lymph nodes involved with Merkel cell carcinoma.  PT1PN0.  PLAN:  1. Stage IIIa (pT2a N1AM0) malignant melanoma: -He does not have any signs or symptoms of recurrence at this time. -Reviewed his labs.  LDH is normal. -Reviewed CT CAP from 12/30/2020 with no significant evidence of progressive disease.  Similar bilateral pulmonary nodules, consistent with benign etiology.  Hepatic steatosis and hepatomegaly.  Similar abdominal adenopathy and similar mild splenomegaly. -Recommend repeat imaging in 6 months with labs.  2. CLL: -He does not have any B symptoms.  No significant findings on the physical exam. -Reviewed labs from 12/30/2020.  White count is 18.2 with normal hemoglobin and platelets.  LDH is normal.  LFTs are normal. -No indication for treatment at this time.  3.  Left kidney lesion: -This was incidental finding.  He does not have any hematuria. -Current CT scan shows 1 cm stable interpolar left kidney lesion.  4.  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  He has 1 high risk feature with positive lymphatic invasion. -Hence  he is receiving adjuvant radiation therapy at [facility].  Orders placed this encounter:  Orders Placed This Encounter  Procedures  . CT CHEST ABDOMEN PELVIS W CONTRAST  . CBC with Differential/Platelet  . Comprehensive metabolic panel  . Lactate dehydrogenase     Doreatha Massed, MD [facility] [phone]   I, Reuel Boom  Khashchuk, am acting as a scribe for Dr. Sanda Linger.  I, Derek Jack MD, have reviewed the above documentation for accuracy and completeness, and I agree with the above.

## 2021-01-06 NOTE — Patient Instructions (Signed)
[place] at [facility] Discharge Instructions  You were seen today by Dr. Delton Coombes. He went over your recent results. You may proceed with your radiation. You will be scheduled to have a CT scan of your chest and abdomen before your next visit. Dr. Delton Coombes will see you back in 6 months for labs and follow up.   Thank you for choosing [place] at [facility] to provide your oncology and hematology care.  To afford each patient quality time with our provider, please arrive at least 15 minutes before your scheduled appointment time.   If you have a lab appointment with the [facility] please come in thru the Main Entrance and check in at the main information desk  You need to re-schedule your appointment should you arrive 10 or more minutes late.  We strive to give you quality time with our providers, and arriving late affects you and other patients whose appointments are after yours.  Also, if you no show three or more times for appointments you may be dismissed from the clinic at the providers discretion.     Again, thank you for choosing [facility].  Our hope is that these requests will decrease the amount of time that you wait before being seen by our physicians.       _____________________________________________________________  Should you have questions after your visit to [facility], please contact our office at [phone] between the hours of 8:00 a.m. and 4:30 p.m.  Voicemails left after 4:00 p.m. will not be returned until the following business day.  For prescription refill requests, have your pharmacy contact our office and allow 72 hours.    Cancer Center Support Programs:   > Cancer Support Group  2nd Tuesday of the month 1pm-2pm, Journey Room

## 2021-01-11 DIAGNOSIS — N400 Enlarged prostate without lower urinary tract symptoms: Secondary | ICD-10-CM | POA: Diagnosis not present

## 2021-01-11 DIAGNOSIS — E782 Mixed hyperlipidemia: Secondary | ICD-10-CM | POA: Diagnosis not present

## 2021-01-11 DIAGNOSIS — D72829 Elevated white blood cell count, unspecified: Secondary | ICD-10-CM | POA: Diagnosis not present

## 2021-01-11 DIAGNOSIS — R972 Elevated prostate specific antigen [PSA]: Secondary | ICD-10-CM | POA: Diagnosis not present

## 2021-01-11 DIAGNOSIS — C4359 Malignant melanoma of other part of trunk: Secondary | ICD-10-CM | POA: Diagnosis not present

## 2021-01-11 DIAGNOSIS — I10 Essential (primary) hypertension: Secondary | ICD-10-CM | POA: Diagnosis not present

## 2021-01-11 DIAGNOSIS — E1165 Type 2 diabetes mellitus with hyperglycemia: Secondary | ICD-10-CM | POA: Diagnosis not present

## 2021-01-11 DIAGNOSIS — C9110 Chronic lymphocytic leukemia of B-cell type not having achieved remission: Secondary | ICD-10-CM | POA: Diagnosis not present

## 2021-01-14 DIAGNOSIS — Z856 Personal history of leukemia: Secondary | ICD-10-CM | POA: Diagnosis not present

## 2021-01-14 DIAGNOSIS — C4A39 Merkel cell carcinoma of other parts of face: Secondary | ICD-10-CM | POA: Diagnosis not present

## 2021-01-14 DIAGNOSIS — Z85820 Personal history of malignant melanoma of skin: Secondary | ICD-10-CM | POA: Diagnosis not present

## 2021-01-18 DIAGNOSIS — C4A39 Merkel cell carcinoma of other parts of face: Secondary | ICD-10-CM | POA: Diagnosis not present

## 2021-01-18 DIAGNOSIS — Z856 Personal history of leukemia: Secondary | ICD-10-CM | POA: Diagnosis not present

## 2021-01-18 DIAGNOSIS — Z85820 Personal history of malignant melanoma of skin: Secondary | ICD-10-CM | POA: Diagnosis not present

## 2021-01-19 DIAGNOSIS — Z85820 Personal history of malignant melanoma of skin: Secondary | ICD-10-CM | POA: Diagnosis not present

## 2021-01-19 DIAGNOSIS — C4A39 Merkel cell carcinoma of other parts of face: Secondary | ICD-10-CM | POA: Diagnosis not present

## 2021-01-19 DIAGNOSIS — Z856 Personal history of leukemia: Secondary | ICD-10-CM | POA: Diagnosis not present

## 2021-01-20 DIAGNOSIS — Z85820 Personal history of malignant melanoma of skin: Secondary | ICD-10-CM | POA: Diagnosis not present

## 2021-01-20 DIAGNOSIS — C4A39 Merkel cell carcinoma of other parts of face: Secondary | ICD-10-CM | POA: Diagnosis not present

## 2021-01-20 DIAGNOSIS — Z856 Personal history of leukemia: Secondary | ICD-10-CM | POA: Diagnosis not present

## 2021-01-21 DIAGNOSIS — C4A39 Merkel cell carcinoma of other parts of face: Secondary | ICD-10-CM | POA: Diagnosis not present

## 2021-01-21 DIAGNOSIS — Z85820 Personal history of malignant melanoma of skin: Secondary | ICD-10-CM | POA: Diagnosis not present

## 2021-01-21 DIAGNOSIS — Z856 Personal history of leukemia: Secondary | ICD-10-CM | POA: Diagnosis not present

## 2021-01-22 DIAGNOSIS — C4A39 Merkel cell carcinoma of other parts of face: Secondary | ICD-10-CM | POA: Diagnosis not present

## 2021-01-22 DIAGNOSIS — Z856 Personal history of leukemia: Secondary | ICD-10-CM | POA: Diagnosis not present

## 2021-01-22 DIAGNOSIS — Z85820 Personal history of malignant melanoma of skin: Secondary | ICD-10-CM | POA: Diagnosis not present

## 2021-01-24 DIAGNOSIS — G4733 Obstructive sleep apnea (adult) (pediatric): Secondary | ICD-10-CM | POA: Diagnosis not present

## 2021-01-25 DIAGNOSIS — Z85820 Personal history of malignant melanoma of skin: Secondary | ICD-10-CM | POA: Diagnosis not present

## 2021-01-25 DIAGNOSIS — C4A39 Merkel cell carcinoma of other parts of face: Secondary | ICD-10-CM | POA: Diagnosis not present

## 2021-01-25 DIAGNOSIS — Z856 Personal history of leukemia: Secondary | ICD-10-CM | POA: Diagnosis not present

## 2021-01-26 DIAGNOSIS — C4A39 Merkel cell carcinoma of other parts of face: Secondary | ICD-10-CM | POA: Diagnosis not present

## 2021-01-26 DIAGNOSIS — Z85820 Personal history of malignant melanoma of skin: Secondary | ICD-10-CM | POA: Diagnosis not present

## 2021-01-26 DIAGNOSIS — Z856 Personal history of leukemia: Secondary | ICD-10-CM | POA: Diagnosis not present

## 2021-01-27 DIAGNOSIS — Z85820 Personal history of malignant melanoma of skin: Secondary | ICD-10-CM | POA: Diagnosis not present

## 2021-01-27 DIAGNOSIS — C4A39 Merkel cell carcinoma of other parts of face: Secondary | ICD-10-CM | POA: Diagnosis not present

## 2021-01-27 DIAGNOSIS — Z856 Personal history of leukemia: Secondary | ICD-10-CM | POA: Diagnosis not present

## 2021-01-28 DIAGNOSIS — Z856 Personal history of leukemia: Secondary | ICD-10-CM | POA: Diagnosis not present

## 2021-01-28 DIAGNOSIS — Z85820 Personal history of malignant melanoma of skin: Secondary | ICD-10-CM | POA: Diagnosis not present

## 2021-01-28 DIAGNOSIS — C4A39 Merkel cell carcinoma of other parts of face: Secondary | ICD-10-CM | POA: Diagnosis not present

## 2021-01-29 DIAGNOSIS — C4A39 Merkel cell carcinoma of other parts of face: Secondary | ICD-10-CM | POA: Diagnosis not present

## 2021-01-29 DIAGNOSIS — Z856 Personal history of leukemia: Secondary | ICD-10-CM | POA: Diagnosis not present

## 2021-01-29 DIAGNOSIS — Z85820 Personal history of malignant melanoma of skin: Secondary | ICD-10-CM | POA: Diagnosis not present

## 2021-02-01 DIAGNOSIS — C4A39 Merkel cell carcinoma of other parts of face: Secondary | ICD-10-CM | POA: Diagnosis not present

## 2021-02-01 DIAGNOSIS — Z856 Personal history of leukemia: Secondary | ICD-10-CM | POA: Diagnosis not present

## 2021-02-01 DIAGNOSIS — Z85820 Personal history of malignant melanoma of skin: Secondary | ICD-10-CM | POA: Diagnosis not present

## 2021-02-02 DIAGNOSIS — Z856 Personal history of leukemia: Secondary | ICD-10-CM | POA: Diagnosis not present

## 2021-02-02 DIAGNOSIS — C4A39 Merkel cell carcinoma of other parts of face: Secondary | ICD-10-CM | POA: Diagnosis not present

## 2021-02-02 DIAGNOSIS — Z85820 Personal history of malignant melanoma of skin: Secondary | ICD-10-CM | POA: Diagnosis not present

## 2021-02-03 DIAGNOSIS — Z85820 Personal history of malignant melanoma of skin: Secondary | ICD-10-CM | POA: Diagnosis not present

## 2021-02-03 DIAGNOSIS — Z856 Personal history of leukemia: Secondary | ICD-10-CM | POA: Diagnosis not present

## 2021-02-03 DIAGNOSIS — C4A39 Merkel cell carcinoma of other parts of face: Secondary | ICD-10-CM | POA: Diagnosis not present

## 2021-02-04 DIAGNOSIS — Z856 Personal history of leukemia: Secondary | ICD-10-CM | POA: Diagnosis not present

## 2021-02-04 DIAGNOSIS — Z85820 Personal history of malignant melanoma of skin: Secondary | ICD-10-CM | POA: Diagnosis not present

## 2021-02-04 DIAGNOSIS — C4A39 Merkel cell carcinoma of other parts of face: Secondary | ICD-10-CM | POA: Diagnosis not present

## 2021-02-05 DIAGNOSIS — Z856 Personal history of leukemia: Secondary | ICD-10-CM | POA: Diagnosis not present

## 2021-02-05 DIAGNOSIS — Z85820 Personal history of malignant melanoma of skin: Secondary | ICD-10-CM | POA: Diagnosis not present

## 2021-02-05 DIAGNOSIS — C4A39 Merkel cell carcinoma of other parts of face: Secondary | ICD-10-CM | POA: Diagnosis not present

## 2021-02-09 DIAGNOSIS — Z856 Personal history of leukemia: Secondary | ICD-10-CM | POA: Diagnosis not present

## 2021-02-09 DIAGNOSIS — C4A39 Merkel cell carcinoma of other parts of face: Secondary | ICD-10-CM | POA: Diagnosis not present

## 2021-02-09 DIAGNOSIS — Z85820 Personal history of malignant melanoma of skin: Secondary | ICD-10-CM | POA: Diagnosis not present

## 2021-02-10 DIAGNOSIS — Z856 Personal history of leukemia: Secondary | ICD-10-CM | POA: Diagnosis not present

## 2021-02-10 DIAGNOSIS — Z85820 Personal history of malignant melanoma of skin: Secondary | ICD-10-CM | POA: Diagnosis not present

## 2021-02-10 DIAGNOSIS — C4A39 Merkel cell carcinoma of other parts of face: Secondary | ICD-10-CM | POA: Diagnosis not present

## 2021-02-11 DIAGNOSIS — H2513 Age-related nuclear cataract, bilateral: Secondary | ICD-10-CM | POA: Diagnosis not present

## 2021-02-11 DIAGNOSIS — H2511 Age-related nuclear cataract, right eye: Secondary | ICD-10-CM | POA: Diagnosis not present

## 2021-02-12 DIAGNOSIS — C4A39 Merkel cell carcinoma of other parts of face: Secondary | ICD-10-CM | POA: Diagnosis not present

## 2021-02-12 DIAGNOSIS — Z85820 Personal history of malignant melanoma of skin: Secondary | ICD-10-CM | POA: Diagnosis not present

## 2021-02-12 DIAGNOSIS — Z856 Personal history of leukemia: Secondary | ICD-10-CM | POA: Diagnosis not present

## 2021-02-15 DIAGNOSIS — Z85820 Personal history of malignant melanoma of skin: Secondary | ICD-10-CM | POA: Diagnosis not present

## 2021-02-15 DIAGNOSIS — C4A39 Merkel cell carcinoma of other parts of face: Secondary | ICD-10-CM | POA: Diagnosis not present

## 2021-02-15 DIAGNOSIS — Z856 Personal history of leukemia: Secondary | ICD-10-CM | POA: Diagnosis not present

## 2021-02-16 DIAGNOSIS — Z85820 Personal history of malignant melanoma of skin: Secondary | ICD-10-CM | POA: Diagnosis not present

## 2021-02-16 DIAGNOSIS — C4A39 Merkel cell carcinoma of other parts of face: Secondary | ICD-10-CM | POA: Diagnosis not present

## 2021-02-16 DIAGNOSIS — Z856 Personal history of leukemia: Secondary | ICD-10-CM | POA: Diagnosis not present

## 2021-02-17 DIAGNOSIS — Z85820 Personal history of malignant melanoma of skin: Secondary | ICD-10-CM | POA: Diagnosis not present

## 2021-02-17 DIAGNOSIS — C4A39 Merkel cell carcinoma of other parts of face: Secondary | ICD-10-CM | POA: Diagnosis not present

## 2021-02-17 DIAGNOSIS — Z856 Personal history of leukemia: Secondary | ICD-10-CM | POA: Diagnosis not present

## 2021-02-18 DIAGNOSIS — C4A39 Merkel cell carcinoma of other parts of face: Secondary | ICD-10-CM | POA: Diagnosis not present

## 2021-02-18 DIAGNOSIS — Z856 Personal history of leukemia: Secondary | ICD-10-CM | POA: Diagnosis not present

## 2021-02-18 DIAGNOSIS — Z85820 Personal history of malignant melanoma of skin: Secondary | ICD-10-CM | POA: Diagnosis not present

## 2021-02-19 DIAGNOSIS — Z856 Personal history of leukemia: Secondary | ICD-10-CM | POA: Diagnosis not present

## 2021-02-19 DIAGNOSIS — C4A39 Merkel cell carcinoma of other parts of face: Secondary | ICD-10-CM | POA: Diagnosis not present

## 2021-02-19 DIAGNOSIS — Z85820 Personal history of malignant melanoma of skin: Secondary | ICD-10-CM | POA: Diagnosis not present

## 2021-02-22 DIAGNOSIS — C4A39 Merkel cell carcinoma of other parts of face: Secondary | ICD-10-CM | POA: Diagnosis not present

## 2021-02-22 DIAGNOSIS — Z856 Personal history of leukemia: Secondary | ICD-10-CM | POA: Diagnosis not present

## 2021-02-22 DIAGNOSIS — Z85820 Personal history of malignant melanoma of skin: Secondary | ICD-10-CM | POA: Diagnosis not present

## 2021-02-23 DIAGNOSIS — Z85820 Personal history of malignant melanoma of skin: Secondary | ICD-10-CM | POA: Diagnosis not present

## 2021-02-23 DIAGNOSIS — C4A39 Merkel cell carcinoma of other parts of face: Secondary | ICD-10-CM | POA: Diagnosis not present

## 2021-02-23 DIAGNOSIS — Z856 Personal history of leukemia: Secondary | ICD-10-CM | POA: Diagnosis not present

## 2021-02-24 DIAGNOSIS — Z85820 Personal history of malignant melanoma of skin: Secondary | ICD-10-CM | POA: Diagnosis not present

## 2021-02-24 DIAGNOSIS — C4A39 Merkel cell carcinoma of other parts of face: Secondary | ICD-10-CM | POA: Diagnosis not present

## 2021-02-24 DIAGNOSIS — Z856 Personal history of leukemia: Secondary | ICD-10-CM | POA: Diagnosis not present

## 2021-02-24 DIAGNOSIS — G4733 Obstructive sleep apnea (adult) (pediatric): Secondary | ICD-10-CM | POA: Diagnosis not present

## 2021-02-25 DIAGNOSIS — Z856 Personal history of leukemia: Secondary | ICD-10-CM | POA: Diagnosis not present

## 2021-02-25 DIAGNOSIS — C4A39 Merkel cell carcinoma of other parts of face: Secondary | ICD-10-CM | POA: Diagnosis not present

## 2021-02-25 DIAGNOSIS — Z85820 Personal history of malignant melanoma of skin: Secondary | ICD-10-CM | POA: Diagnosis not present

## 2021-02-26 DIAGNOSIS — C4A39 Merkel cell carcinoma of other parts of face: Secondary | ICD-10-CM | POA: Diagnosis not present

## 2021-02-26 DIAGNOSIS — Z85820 Personal history of malignant melanoma of skin: Secondary | ICD-10-CM | POA: Diagnosis not present

## 2021-02-26 DIAGNOSIS — Z856 Personal history of leukemia: Secondary | ICD-10-CM | POA: Diagnosis not present

## 2021-03-03 DIAGNOSIS — G4733 Obstructive sleep apnea (adult) (pediatric): Secondary | ICD-10-CM | POA: Diagnosis not present

## 2021-03-10 DIAGNOSIS — H5703 Miosis: Secondary | ICD-10-CM | POA: Diagnosis not present

## 2021-03-10 DIAGNOSIS — H2511 Age-related nuclear cataract, right eye: Secondary | ICD-10-CM | POA: Diagnosis not present

## 2021-03-26 DIAGNOSIS — G4733 Obstructive sleep apnea (adult) (pediatric): Secondary | ICD-10-CM | POA: Diagnosis not present

## 2021-03-30 DIAGNOSIS — H5703 Miosis: Secondary | ICD-10-CM | POA: Diagnosis not present

## 2021-03-30 DIAGNOSIS — H2512 Age-related nuclear cataract, left eye: Secondary | ICD-10-CM | POA: Diagnosis not present

## 2021-04-13 DIAGNOSIS — Z9842 Cataract extraction status, left eye: Secondary | ICD-10-CM | POA: Diagnosis not present

## 2021-04-13 DIAGNOSIS — Z961 Presence of intraocular lens: Secondary | ICD-10-CM | POA: Diagnosis not present

## 2021-04-13 DIAGNOSIS — H25812 Combined forms of age-related cataract, left eye: Secondary | ICD-10-CM | POA: Diagnosis not present

## 2021-04-27 DIAGNOSIS — C9110 Chronic lymphocytic leukemia of B-cell type not having achieved remission: Secondary | ICD-10-CM | POA: Diagnosis not present

## 2021-04-27 DIAGNOSIS — C4A30 Merkel cell carcinoma of unspecified part of face: Secondary | ICD-10-CM | POA: Diagnosis not present

## 2021-04-27 DIAGNOSIS — C4359 Malignant melanoma of other part of trunk: Secondary | ICD-10-CM | POA: Diagnosis not present

## 2021-05-31 DIAGNOSIS — X32XXXD Exposure to sunlight, subsequent encounter: Secondary | ICD-10-CM | POA: Diagnosis not present

## 2021-05-31 DIAGNOSIS — Z08 Encounter for follow-up examination after completed treatment for malignant neoplasm: Secondary | ICD-10-CM | POA: Diagnosis not present

## 2021-05-31 DIAGNOSIS — Z1283 Encounter for screening for malignant neoplasm of skin: Secondary | ICD-10-CM | POA: Diagnosis not present

## 2021-05-31 DIAGNOSIS — Z85821 Personal history of Merkel cell carcinoma: Secondary | ICD-10-CM | POA: Diagnosis not present

## 2021-05-31 DIAGNOSIS — Z85820 Personal history of malignant melanoma of skin: Secondary | ICD-10-CM | POA: Diagnosis not present

## 2021-05-31 DIAGNOSIS — D485 Neoplasm of uncertain behavior of skin: Secondary | ICD-10-CM | POA: Diagnosis not present

## 2021-05-31 DIAGNOSIS — L570 Actinic keratosis: Secondary | ICD-10-CM | POA: Diagnosis not present

## 2021-05-31 DIAGNOSIS — D225 Melanocytic nevi of trunk: Secondary | ICD-10-CM | POA: Diagnosis not present

## 2021-07-01 ENCOUNTER — Ambulatory Visit (HOSPITAL_COMMUNITY): Payer: Medicare HMO

## 2021-07-01 ENCOUNTER — Inpatient Hospital Stay (HOSPITAL_COMMUNITY): Payer: Medicare PPO | Attending: Hematology

## 2021-07-08 ENCOUNTER — Ambulatory Visit (HOSPITAL_COMMUNITY): Payer: Medicare PPO | Admitting: Hematology

## 2021-08-13 DIAGNOSIS — E1165 Type 2 diabetes mellitus with hyperglycemia: Secondary | ICD-10-CM | POA: Diagnosis not present

## 2021-08-13 DIAGNOSIS — R350 Frequency of micturition: Secondary | ICD-10-CM | POA: Diagnosis not present

## 2021-08-24 DIAGNOSIS — M9901 Segmental and somatic dysfunction of cervical region: Secondary | ICD-10-CM | POA: Diagnosis not present

## 2021-08-24 DIAGNOSIS — M9907 Segmental and somatic dysfunction of upper extremity: Secondary | ICD-10-CM | POA: Diagnosis not present

## 2021-08-24 DIAGNOSIS — M9903 Segmental and somatic dysfunction of lumbar region: Secondary | ICD-10-CM | POA: Diagnosis not present

## 2021-08-24 DIAGNOSIS — M47818 Spondylosis without myelopathy or radiculopathy, sacral and sacrococcygeal region: Secondary | ICD-10-CM | POA: Diagnosis not present

## 2021-08-24 DIAGNOSIS — M9902 Segmental and somatic dysfunction of thoracic region: Secondary | ICD-10-CM | POA: Diagnosis not present

## 2021-08-24 DIAGNOSIS — M4724 Other spondylosis with radiculopathy, thoracic region: Secondary | ICD-10-CM | POA: Diagnosis not present

## 2021-08-24 DIAGNOSIS — M9904 Segmental and somatic dysfunction of sacral region: Secondary | ICD-10-CM | POA: Diagnosis not present

## 2021-08-24 DIAGNOSIS — M47816 Spondylosis without myelopathy or radiculopathy, lumbar region: Secondary | ICD-10-CM | POA: Diagnosis not present

## 2021-08-24 DIAGNOSIS — M4722 Other spondylosis with radiculopathy, cervical region: Secondary | ICD-10-CM | POA: Diagnosis not present

## 2021-08-25 DIAGNOSIS — M4722 Other spondylosis with radiculopathy, cervical region: Secondary | ICD-10-CM | POA: Diagnosis not present

## 2021-08-25 DIAGNOSIS — M9902 Segmental and somatic dysfunction of thoracic region: Secondary | ICD-10-CM | POA: Diagnosis not present

## 2021-08-25 DIAGNOSIS — M9904 Segmental and somatic dysfunction of sacral region: Secondary | ICD-10-CM | POA: Diagnosis not present

## 2021-08-25 DIAGNOSIS — M4724 Other spondylosis with radiculopathy, thoracic region: Secondary | ICD-10-CM | POA: Diagnosis not present

## 2021-08-25 DIAGNOSIS — M47818 Spondylosis without myelopathy or radiculopathy, sacral and sacrococcygeal region: Secondary | ICD-10-CM | POA: Diagnosis not present

## 2021-08-25 DIAGNOSIS — M9903 Segmental and somatic dysfunction of lumbar region: Secondary | ICD-10-CM | POA: Diagnosis not present

## 2021-08-25 DIAGNOSIS — M9907 Segmental and somatic dysfunction of upper extremity: Secondary | ICD-10-CM | POA: Diagnosis not present

## 2021-08-25 DIAGNOSIS — M47816 Spondylosis without myelopathy or radiculopathy, lumbar region: Secondary | ICD-10-CM | POA: Diagnosis not present

## 2021-08-25 DIAGNOSIS — M9901 Segmental and somatic dysfunction of cervical region: Secondary | ICD-10-CM | POA: Diagnosis not present

## 2021-08-26 DIAGNOSIS — M47816 Spondylosis without myelopathy or radiculopathy, lumbar region: Secondary | ICD-10-CM | POA: Diagnosis not present

## 2021-08-26 DIAGNOSIS — M9903 Segmental and somatic dysfunction of lumbar region: Secondary | ICD-10-CM | POA: Diagnosis not present

## 2021-08-26 DIAGNOSIS — M9901 Segmental and somatic dysfunction of cervical region: Secondary | ICD-10-CM | POA: Diagnosis not present

## 2021-08-26 DIAGNOSIS — M4724 Other spondylosis with radiculopathy, thoracic region: Secondary | ICD-10-CM | POA: Diagnosis not present

## 2021-08-26 DIAGNOSIS — M4722 Other spondylosis with radiculopathy, cervical region: Secondary | ICD-10-CM | POA: Diagnosis not present

## 2021-08-26 DIAGNOSIS — M9907 Segmental and somatic dysfunction of upper extremity: Secondary | ICD-10-CM | POA: Diagnosis not present

## 2021-08-26 DIAGNOSIS — M9902 Segmental and somatic dysfunction of thoracic region: Secondary | ICD-10-CM | POA: Diagnosis not present

## 2021-08-26 DIAGNOSIS — M9904 Segmental and somatic dysfunction of sacral region: Secondary | ICD-10-CM | POA: Diagnosis not present

## 2021-08-26 DIAGNOSIS — M47818 Spondylosis without myelopathy or radiculopathy, sacral and sacrococcygeal region: Secondary | ICD-10-CM | POA: Diagnosis not present

## 2021-08-30 DIAGNOSIS — M4722 Other spondylosis with radiculopathy, cervical region: Secondary | ICD-10-CM | POA: Diagnosis not present

## 2021-08-30 DIAGNOSIS — M9902 Segmental and somatic dysfunction of thoracic region: Secondary | ICD-10-CM | POA: Diagnosis not present

## 2021-08-30 DIAGNOSIS — M9901 Segmental and somatic dysfunction of cervical region: Secondary | ICD-10-CM | POA: Diagnosis not present

## 2021-08-30 DIAGNOSIS — M9904 Segmental and somatic dysfunction of sacral region: Secondary | ICD-10-CM | POA: Diagnosis not present

## 2021-08-30 DIAGNOSIS — M9907 Segmental and somatic dysfunction of upper extremity: Secondary | ICD-10-CM | POA: Diagnosis not present

## 2021-08-30 DIAGNOSIS — M47818 Spondylosis without myelopathy or radiculopathy, sacral and sacrococcygeal region: Secondary | ICD-10-CM | POA: Diagnosis not present

## 2021-08-30 DIAGNOSIS — M4724 Other spondylosis with radiculopathy, thoracic region: Secondary | ICD-10-CM | POA: Diagnosis not present

## 2021-08-30 DIAGNOSIS — M47816 Spondylosis without myelopathy or radiculopathy, lumbar region: Secondary | ICD-10-CM | POA: Diagnosis not present

## 2021-08-30 DIAGNOSIS — M9903 Segmental and somatic dysfunction of lumbar region: Secondary | ICD-10-CM | POA: Diagnosis not present

## 2021-08-31 DIAGNOSIS — M9904 Segmental and somatic dysfunction of sacral region: Secondary | ICD-10-CM | POA: Diagnosis not present

## 2021-08-31 DIAGNOSIS — M9903 Segmental and somatic dysfunction of lumbar region: Secondary | ICD-10-CM | POA: Diagnosis not present

## 2021-08-31 DIAGNOSIS — M9902 Segmental and somatic dysfunction of thoracic region: Secondary | ICD-10-CM | POA: Diagnosis not present

## 2021-08-31 DIAGNOSIS — M9907 Segmental and somatic dysfunction of upper extremity: Secondary | ICD-10-CM | POA: Diagnosis not present

## 2021-08-31 DIAGNOSIS — M4724 Other spondylosis with radiculopathy, thoracic region: Secondary | ICD-10-CM | POA: Diagnosis not present

## 2021-08-31 DIAGNOSIS — M47818 Spondylosis without myelopathy or radiculopathy, sacral and sacrococcygeal region: Secondary | ICD-10-CM | POA: Diagnosis not present

## 2021-08-31 DIAGNOSIS — M4722 Other spondylosis with radiculopathy, cervical region: Secondary | ICD-10-CM | POA: Diagnosis not present

## 2021-08-31 DIAGNOSIS — M47816 Spondylosis without myelopathy or radiculopathy, lumbar region: Secondary | ICD-10-CM | POA: Diagnosis not present

## 2021-08-31 DIAGNOSIS — M9901 Segmental and somatic dysfunction of cervical region: Secondary | ICD-10-CM | POA: Diagnosis not present

## 2021-09-01 DIAGNOSIS — M47816 Spondylosis without myelopathy or radiculopathy, lumbar region: Secondary | ICD-10-CM | POA: Diagnosis not present

## 2021-09-01 DIAGNOSIS — M9901 Segmental and somatic dysfunction of cervical region: Secondary | ICD-10-CM | POA: Diagnosis not present

## 2021-09-01 DIAGNOSIS — M9903 Segmental and somatic dysfunction of lumbar region: Secondary | ICD-10-CM | POA: Diagnosis not present

## 2021-09-01 DIAGNOSIS — M47818 Spondylosis without myelopathy or radiculopathy, sacral and sacrococcygeal region: Secondary | ICD-10-CM | POA: Diagnosis not present

## 2021-09-01 DIAGNOSIS — M9907 Segmental and somatic dysfunction of upper extremity: Secondary | ICD-10-CM | POA: Diagnosis not present

## 2021-09-01 DIAGNOSIS — M9904 Segmental and somatic dysfunction of sacral region: Secondary | ICD-10-CM | POA: Diagnosis not present

## 2021-09-01 DIAGNOSIS — M4722 Other spondylosis with radiculopathy, cervical region: Secondary | ICD-10-CM | POA: Diagnosis not present

## 2021-09-01 DIAGNOSIS — M4724 Other spondylosis with radiculopathy, thoracic region: Secondary | ICD-10-CM | POA: Diagnosis not present

## 2021-09-01 DIAGNOSIS — M9902 Segmental and somatic dysfunction of thoracic region: Secondary | ICD-10-CM | POA: Diagnosis not present

## 2021-09-02 DIAGNOSIS — M4724 Other spondylosis with radiculopathy, thoracic region: Secondary | ICD-10-CM | POA: Diagnosis not present

## 2021-09-02 DIAGNOSIS — M9907 Segmental and somatic dysfunction of upper extremity: Secondary | ICD-10-CM | POA: Diagnosis not present

## 2021-09-02 DIAGNOSIS — M9904 Segmental and somatic dysfunction of sacral region: Secondary | ICD-10-CM | POA: Diagnosis not present

## 2021-09-02 DIAGNOSIS — M9903 Segmental and somatic dysfunction of lumbar region: Secondary | ICD-10-CM | POA: Diagnosis not present

## 2021-09-02 DIAGNOSIS — M4722 Other spondylosis with radiculopathy, cervical region: Secondary | ICD-10-CM | POA: Diagnosis not present

## 2021-09-02 DIAGNOSIS — M47816 Spondylosis without myelopathy or radiculopathy, lumbar region: Secondary | ICD-10-CM | POA: Diagnosis not present

## 2021-09-02 DIAGNOSIS — M47818 Spondylosis without myelopathy or radiculopathy, sacral and sacrococcygeal region: Secondary | ICD-10-CM | POA: Diagnosis not present

## 2021-09-02 DIAGNOSIS — M9901 Segmental and somatic dysfunction of cervical region: Secondary | ICD-10-CM | POA: Diagnosis not present

## 2021-09-02 DIAGNOSIS — M9902 Segmental and somatic dysfunction of thoracic region: Secondary | ICD-10-CM | POA: Diagnosis not present

## 2021-09-06 DIAGNOSIS — I10 Essential (primary) hypertension: Secondary | ICD-10-CM | POA: Diagnosis not present

## 2021-09-06 DIAGNOSIS — Z9849 Cataract extraction status, unspecified eye: Secondary | ICD-10-CM | POA: Diagnosis not present

## 2021-09-06 DIAGNOSIS — H5202 Hypermetropia, left eye: Secondary | ICD-10-CM | POA: Diagnosis not present

## 2021-09-06 DIAGNOSIS — Z961 Presence of intraocular lens: Secondary | ICD-10-CM | POA: Diagnosis not present

## 2021-09-06 DIAGNOSIS — E119 Type 2 diabetes mellitus without complications: Secondary | ICD-10-CM | POA: Diagnosis not present

## 2021-09-06 DIAGNOSIS — H35033 Hypertensive retinopathy, bilateral: Secondary | ICD-10-CM | POA: Diagnosis not present

## 2021-09-06 DIAGNOSIS — H25813 Combined forms of age-related cataract, bilateral: Secondary | ICD-10-CM | POA: Diagnosis not present

## 2021-09-06 DIAGNOSIS — D3132 Benign neoplasm of left choroid: Secondary | ICD-10-CM | POA: Diagnosis not present

## 2021-09-06 DIAGNOSIS — H43811 Vitreous degeneration, right eye: Secondary | ICD-10-CM | POA: Diagnosis not present

## 2021-09-07 DIAGNOSIS — M4722 Other spondylosis with radiculopathy, cervical region: Secondary | ICD-10-CM | POA: Diagnosis not present

## 2021-09-07 DIAGNOSIS — M4724 Other spondylosis with radiculopathy, thoracic region: Secondary | ICD-10-CM | POA: Diagnosis not present

## 2021-09-07 DIAGNOSIS — M9902 Segmental and somatic dysfunction of thoracic region: Secondary | ICD-10-CM | POA: Diagnosis not present

## 2021-09-07 DIAGNOSIS — M9904 Segmental and somatic dysfunction of sacral region: Secondary | ICD-10-CM | POA: Diagnosis not present

## 2021-09-07 DIAGNOSIS — M47818 Spondylosis without myelopathy or radiculopathy, sacral and sacrococcygeal region: Secondary | ICD-10-CM | POA: Diagnosis not present

## 2021-09-07 DIAGNOSIS — M9901 Segmental and somatic dysfunction of cervical region: Secondary | ICD-10-CM | POA: Diagnosis not present

## 2021-09-07 DIAGNOSIS — M47816 Spondylosis without myelopathy or radiculopathy, lumbar region: Secondary | ICD-10-CM | POA: Diagnosis not present

## 2021-09-07 DIAGNOSIS — M9903 Segmental and somatic dysfunction of lumbar region: Secondary | ICD-10-CM | POA: Diagnosis not present

## 2021-09-07 DIAGNOSIS — M9907 Segmental and somatic dysfunction of upper extremity: Secondary | ICD-10-CM | POA: Diagnosis not present

## 2021-09-08 DIAGNOSIS — M4724 Other spondylosis with radiculopathy, thoracic region: Secondary | ICD-10-CM | POA: Diagnosis not present

## 2021-09-08 DIAGNOSIS — M9901 Segmental and somatic dysfunction of cervical region: Secondary | ICD-10-CM | POA: Diagnosis not present

## 2021-09-08 DIAGNOSIS — M9903 Segmental and somatic dysfunction of lumbar region: Secondary | ICD-10-CM | POA: Diagnosis not present

## 2021-09-08 DIAGNOSIS — M9902 Segmental and somatic dysfunction of thoracic region: Secondary | ICD-10-CM | POA: Diagnosis not present

## 2021-09-08 DIAGNOSIS — M47818 Spondylosis without myelopathy or radiculopathy, sacral and sacrococcygeal region: Secondary | ICD-10-CM | POA: Diagnosis not present

## 2021-09-08 DIAGNOSIS — M9907 Segmental and somatic dysfunction of upper extremity: Secondary | ICD-10-CM | POA: Diagnosis not present

## 2021-09-08 DIAGNOSIS — M9904 Segmental and somatic dysfunction of sacral region: Secondary | ICD-10-CM | POA: Diagnosis not present

## 2021-09-08 DIAGNOSIS — M47816 Spondylosis without myelopathy or radiculopathy, lumbar region: Secondary | ICD-10-CM | POA: Diagnosis not present

## 2021-09-08 DIAGNOSIS — M4722 Other spondylosis with radiculopathy, cervical region: Secondary | ICD-10-CM | POA: Diagnosis not present

## 2021-09-09 DIAGNOSIS — M47818 Spondylosis without myelopathy or radiculopathy, sacral and sacrococcygeal region: Secondary | ICD-10-CM | POA: Diagnosis not present

## 2021-09-09 DIAGNOSIS — M9904 Segmental and somatic dysfunction of sacral region: Secondary | ICD-10-CM | POA: Diagnosis not present

## 2021-09-09 DIAGNOSIS — M9903 Segmental and somatic dysfunction of lumbar region: Secondary | ICD-10-CM | POA: Diagnosis not present

## 2021-09-09 DIAGNOSIS — M4722 Other spondylosis with radiculopathy, cervical region: Secondary | ICD-10-CM | POA: Diagnosis not present

## 2021-09-09 DIAGNOSIS — M47816 Spondylosis without myelopathy or radiculopathy, lumbar region: Secondary | ICD-10-CM | POA: Diagnosis not present

## 2021-09-09 DIAGNOSIS — M9901 Segmental and somatic dysfunction of cervical region: Secondary | ICD-10-CM | POA: Diagnosis not present

## 2021-09-09 DIAGNOSIS — M9902 Segmental and somatic dysfunction of thoracic region: Secondary | ICD-10-CM | POA: Diagnosis not present

## 2021-09-09 DIAGNOSIS — M9907 Segmental and somatic dysfunction of upper extremity: Secondary | ICD-10-CM | POA: Diagnosis not present

## 2021-09-09 DIAGNOSIS — M4724 Other spondylosis with radiculopathy, thoracic region: Secondary | ICD-10-CM | POA: Diagnosis not present

## 2021-09-13 DIAGNOSIS — M9902 Segmental and somatic dysfunction of thoracic region: Secondary | ICD-10-CM | POA: Diagnosis not present

## 2021-09-13 DIAGNOSIS — M9907 Segmental and somatic dysfunction of upper extremity: Secondary | ICD-10-CM | POA: Diagnosis not present

## 2021-09-13 DIAGNOSIS — M47818 Spondylosis without myelopathy or radiculopathy, sacral and sacrococcygeal region: Secondary | ICD-10-CM | POA: Diagnosis not present

## 2021-09-13 DIAGNOSIS — M47816 Spondylosis without myelopathy or radiculopathy, lumbar region: Secondary | ICD-10-CM | POA: Diagnosis not present

## 2021-09-13 DIAGNOSIS — M9903 Segmental and somatic dysfunction of lumbar region: Secondary | ICD-10-CM | POA: Diagnosis not present

## 2021-09-13 DIAGNOSIS — M4724 Other spondylosis with radiculopathy, thoracic region: Secondary | ICD-10-CM | POA: Diagnosis not present

## 2021-09-13 DIAGNOSIS — M9901 Segmental and somatic dysfunction of cervical region: Secondary | ICD-10-CM | POA: Diagnosis not present

## 2021-09-13 DIAGNOSIS — M9904 Segmental and somatic dysfunction of sacral region: Secondary | ICD-10-CM | POA: Diagnosis not present

## 2021-09-13 DIAGNOSIS — M4722 Other spondylosis with radiculopathy, cervical region: Secondary | ICD-10-CM | POA: Diagnosis not present

## 2021-09-14 DIAGNOSIS — R972 Elevated prostate specific antigen [PSA]: Secondary | ICD-10-CM | POA: Diagnosis not present

## 2021-09-14 DIAGNOSIS — E119 Type 2 diabetes mellitus without complications: Secondary | ICD-10-CM | POA: Diagnosis not present

## 2021-09-14 DIAGNOSIS — I10 Essential (primary) hypertension: Secondary | ICD-10-CM | POA: Diagnosis not present

## 2021-09-15 DIAGNOSIS — M4724 Other spondylosis with radiculopathy, thoracic region: Secondary | ICD-10-CM | POA: Diagnosis not present

## 2021-09-15 DIAGNOSIS — M9904 Segmental and somatic dysfunction of sacral region: Secondary | ICD-10-CM | POA: Diagnosis not present

## 2021-09-15 DIAGNOSIS — M9902 Segmental and somatic dysfunction of thoracic region: Secondary | ICD-10-CM | POA: Diagnosis not present

## 2021-09-15 DIAGNOSIS — M4722 Other spondylosis with radiculopathy, cervical region: Secondary | ICD-10-CM | POA: Diagnosis not present

## 2021-09-15 DIAGNOSIS — M9901 Segmental and somatic dysfunction of cervical region: Secondary | ICD-10-CM | POA: Diagnosis not present

## 2021-09-15 DIAGNOSIS — M47816 Spondylosis without myelopathy or radiculopathy, lumbar region: Secondary | ICD-10-CM | POA: Diagnosis not present

## 2021-09-15 DIAGNOSIS — M9903 Segmental and somatic dysfunction of lumbar region: Secondary | ICD-10-CM | POA: Diagnosis not present

## 2021-09-15 DIAGNOSIS — M9907 Segmental and somatic dysfunction of upper extremity: Secondary | ICD-10-CM | POA: Diagnosis not present

## 2021-09-15 DIAGNOSIS — M47818 Spondylosis without myelopathy or radiculopathy, sacral and sacrococcygeal region: Secondary | ICD-10-CM | POA: Diagnosis not present

## 2021-09-21 DIAGNOSIS — R5383 Other fatigue: Secondary | ICD-10-CM | POA: Diagnosis not present

## 2021-09-21 DIAGNOSIS — N400 Enlarged prostate without lower urinary tract symptoms: Secondary | ICD-10-CM | POA: Diagnosis not present

## 2021-09-21 DIAGNOSIS — C9110 Chronic lymphocytic leukemia of B-cell type not having achieved remission: Secondary | ICD-10-CM | POA: Diagnosis not present

## 2021-09-21 DIAGNOSIS — E782 Mixed hyperlipidemia: Secondary | ICD-10-CM | POA: Diagnosis not present

## 2021-09-21 DIAGNOSIS — J302 Other seasonal allergic rhinitis: Secondary | ICD-10-CM | POA: Diagnosis not present

## 2021-09-21 DIAGNOSIS — E119 Type 2 diabetes mellitus without complications: Secondary | ICD-10-CM | POA: Diagnosis not present

## 2021-09-21 DIAGNOSIS — R972 Elevated prostate specific antigen [PSA]: Secondary | ICD-10-CM | POA: Diagnosis not present

## 2021-09-21 DIAGNOSIS — I10 Essential (primary) hypertension: Secondary | ICD-10-CM | POA: Diagnosis not present

## 2021-09-21 DIAGNOSIS — R809 Proteinuria, unspecified: Secondary | ICD-10-CM | POA: Diagnosis not present

## 2021-09-26 DIAGNOSIS — G928 Other toxic encephalopathy: Secondary | ICD-10-CM | POA: Diagnosis not present

## 2021-09-26 DIAGNOSIS — C4A9 Merkel cell carcinoma, unspecified: Secondary | ICD-10-CM | POA: Diagnosis not present

## 2021-09-26 DIAGNOSIS — M5126 Other intervertebral disc displacement, lumbar region: Secondary | ICD-10-CM | POA: Diagnosis not present

## 2021-09-26 DIAGNOSIS — D72829 Elevated white blood cell count, unspecified: Secondary | ICD-10-CM | POA: Diagnosis not present

## 2021-09-26 DIAGNOSIS — D6959 Other secondary thrombocytopenia: Secondary | ICD-10-CM | POA: Diagnosis not present

## 2021-09-26 DIAGNOSIS — D649 Anemia, unspecified: Secondary | ICD-10-CM | POA: Diagnosis not present

## 2021-09-26 DIAGNOSIS — D696 Thrombocytopenia, unspecified: Secondary | ICD-10-CM | POA: Diagnosis not present

## 2021-09-26 DIAGNOSIS — R918 Other nonspecific abnormal finding of lung field: Secondary | ICD-10-CM | POA: Diagnosis not present

## 2021-09-26 DIAGNOSIS — Z20822 Contact with and (suspected) exposure to covid-19: Secondary | ICD-10-CM | POA: Diagnosis not present

## 2021-09-26 DIAGNOSIS — E43 Unspecified severe protein-calorie malnutrition: Secondary | ICD-10-CM | POA: Diagnosis not present

## 2021-09-26 DIAGNOSIS — R638 Other symptoms and signs concerning food and fluid intake: Secondary | ICD-10-CM | POA: Diagnosis not present

## 2021-09-26 DIAGNOSIS — C4362 Malignant melanoma of left upper limb, including shoulder: Secondary | ICD-10-CM | POA: Diagnosis not present

## 2021-09-26 DIAGNOSIS — M5002 Cervical disc disorder with myelopathy, mid-cervical region, unspecified level: Secondary | ICD-10-CM | POA: Diagnosis not present

## 2021-09-26 DIAGNOSIS — R0602 Shortness of breath: Secondary | ICD-10-CM | POA: Diagnosis not present

## 2021-09-26 DIAGNOSIS — C4A39 Merkel cell carcinoma of other parts of face: Secondary | ICD-10-CM | POA: Diagnosis not present

## 2021-09-26 DIAGNOSIS — M47814 Spondylosis without myelopathy or radiculopathy, thoracic region: Secondary | ICD-10-CM | POA: Diagnosis not present

## 2021-09-26 DIAGNOSIS — G893 Neoplasm related pain (acute) (chronic): Secondary | ICD-10-CM | POA: Diagnosis not present

## 2021-09-26 DIAGNOSIS — M50222 Other cervical disc displacement at C5-C6 level: Secondary | ICD-10-CM | POA: Diagnosis not present

## 2021-09-26 DIAGNOSIS — D61818 Other pancytopenia: Secondary | ICD-10-CM | POA: Diagnosis not present

## 2021-09-26 DIAGNOSIS — I700 Atherosclerosis of aorta: Secondary | ICD-10-CM | POA: Diagnosis not present

## 2021-09-26 DIAGNOSIS — R634 Abnormal weight loss: Secondary | ICD-10-CM | POA: Diagnosis not present

## 2021-09-26 DIAGNOSIS — N179 Acute kidney failure, unspecified: Secondary | ICD-10-CM | POA: Diagnosis not present

## 2021-09-26 DIAGNOSIS — E790 Hyperuricemia without signs of inflammatory arthritis and tophaceous disease: Secondary | ICD-10-CM | POA: Diagnosis not present

## 2021-09-26 DIAGNOSIS — E883 Tumor lysis syndrome: Secondary | ICD-10-CM | POA: Diagnosis not present

## 2021-09-26 DIAGNOSIS — C7951 Secondary malignant neoplasm of bone: Secondary | ICD-10-CM | POA: Diagnosis not present

## 2021-09-26 DIAGNOSIS — C4A30 Merkel cell carcinoma of unspecified part of face: Secondary | ICD-10-CM | POA: Diagnosis not present

## 2021-09-26 DIAGNOSIS — M4802 Spinal stenosis, cervical region: Secondary | ICD-10-CM | POA: Diagnosis not present

## 2021-09-26 DIAGNOSIS — M47816 Spondylosis without myelopathy or radiculopathy, lumbar region: Secondary | ICD-10-CM | POA: Diagnosis not present

## 2021-09-26 DIAGNOSIS — Z515 Encounter for palliative care: Secondary | ICD-10-CM | POA: Diagnosis not present

## 2021-09-26 DIAGNOSIS — E1165 Type 2 diabetes mellitus with hyperglycemia: Secondary | ICD-10-CM | POA: Diagnosis not present

## 2021-09-26 DIAGNOSIS — M47812 Spondylosis without myelopathy or radiculopathy, cervical region: Secondary | ICD-10-CM | POA: Diagnosis not present

## 2021-09-26 DIAGNOSIS — M549 Dorsalgia, unspecified: Secondary | ICD-10-CM | POA: Diagnosis not present

## 2021-09-26 DIAGNOSIS — C439 Malignant melanoma of skin, unspecified: Secondary | ICD-10-CM | POA: Diagnosis not present

## 2021-09-26 DIAGNOSIS — E119 Type 2 diabetes mellitus without complications: Secondary | ICD-10-CM | POA: Diagnosis not present

## 2021-09-26 DIAGNOSIS — C4A71 Merkel cell carcinoma of right lower limb, including hip: Secondary | ICD-10-CM | POA: Diagnosis not present

## 2021-09-26 DIAGNOSIS — M5020 Other cervical disc displacement, unspecified cervical region: Secondary | ICD-10-CM | POA: Diagnosis not present

## 2021-09-26 DIAGNOSIS — C9110 Chronic lymphocytic leukemia of B-cell type not having achieved remission: Secondary | ICD-10-CM | POA: Diagnosis not present

## 2021-09-26 DIAGNOSIS — Z794 Long term (current) use of insulin: Secondary | ICD-10-CM | POA: Diagnosis not present

## 2021-09-26 DIAGNOSIS — E118 Type 2 diabetes mellitus with unspecified complications: Secondary | ICD-10-CM | POA: Diagnosis not present

## 2021-09-26 DIAGNOSIS — R4182 Altered mental status, unspecified: Secondary | ICD-10-CM | POA: Diagnosis not present

## 2021-09-26 DIAGNOSIS — R5383 Other fatigue: Secondary | ICD-10-CM | POA: Diagnosis not present

## 2021-09-26 DIAGNOSIS — R530 Neoplastic (malignant) related fatigue: Secondary | ICD-10-CM | POA: Diagnosis not present

## 2021-09-26 DIAGNOSIS — C4359 Malignant melanoma of other part of trunk: Secondary | ICD-10-CM | POA: Diagnosis not present

## 2021-09-26 DIAGNOSIS — T380X5A Adverse effect of glucocorticoids and synthetic analogues, initial encounter: Secondary | ICD-10-CM | POA: Diagnosis not present

## 2021-09-26 DIAGNOSIS — D630 Anemia in neoplastic disease: Secondary | ICD-10-CM | POA: Diagnosis not present

## 2021-09-26 DIAGNOSIS — I10 Essential (primary) hypertension: Secondary | ICD-10-CM | POA: Diagnosis not present

## 2021-09-26 DIAGNOSIS — R222 Localized swelling, mass and lump, trunk: Secondary | ICD-10-CM | POA: Diagnosis not present

## 2021-09-26 DIAGNOSIS — R531 Weakness: Secondary | ICD-10-CM | POA: Diagnosis not present

## 2021-10-01 ENCOUNTER — Ambulatory Visit: Payer: Medicare HMO | Admitting: Urology

## 2021-10-01 NOTE — Progress Notes (Deleted)
   Assessment: No diagnosis found.   Plan: ***  Chief Complaint: No chief complaint on file.   History of Present Illness:  Mark Huang is a 70 y.o. year old male who is seen in consultation from Celene Squibb, MD for evaluation of elevated PSA.   Past Medical History:  Past Medical History:  Diagnosis Date   BPH (benign prostatic hyperplasia)    Cancer ([facility])    COPD (chronic obstructive pulmonary disease) ([facility])    Diabetes mellitus without complication ([facility])    Hypertension    Seasonal allergies    Sleep apnea with use of continuous positive airway pressure (CPAP)    uses CPAP at night     Past Surgical History:  Past Surgical History:  Procedure Laterality Date   APPENDECTOMY     CRANIOTOMY Left 12/30/2015   Procedure: BURR HOLE HEMATOMA EVACUATION SUBDURAL;  Surgeon: Kary Kos, MD;  Location: [facility];  Service: Neurosurgery;  Laterality: Left;   TONSILLECTOMY      Allergies:  Allergies  Allergen Reactions   Penicillins     Has patient had a PCN reaction causing immediate rash, facial/tongue/throat swelling, SOB or lightheadedness with hypotension: unknown Has patient had a PCN reaction causing severe rash involving mucus membranes or skin necrosis: unknown Has patient had a PCN reaction that required hospitalization: unknown Has patient had a PCN reaction occurring within the last 10 years: No If all of the above answers are "NO", then may proceed with   Sulfa Antibiotics    Oxycodone Nausea And Vomiting    Family History:  No family history on file.  Social History:  Social History   Tobacco Use   Smoking status: Never   Smokeless tobacco: Never  Substance Use Topics   Alcohol use: No   Drug use: No    Review of symptoms:  Constitutional:  Negative for unexplained weight loss, night sweats, fever, chills ENT:  Negative for nose bleeds, sinus pain, painful swallowing CV:  Negative for chest pain, shortness of breath, exercise  intolerance, palpitations, loss of consciousness Resp:  Negative for cough, wheezing, shortness of breath GI:  Negative for nausea, vomiting, diarrhea, bloody stools GU:  Positives noted in HPI; otherwise negative for gross hematuria, dysuria, urinary incontinence Neuro:  Negative for seizures, poor balance, limb weakness, slurred speech Psych:  Negative for lack of energy, depression, anxiety Endocrine:  Negative for polydipsia, polyuria, symptoms of hypoglycemia (dizziness, hunger, sweating) Hematologic:  Negative for anemia, purpura, petechia, prolonged or excessive bleeding, use of anticoagulants  Allergic:  Negative for difficulty breathing or choking as a result of exposure to anything; no shellfish allergy; no allergic response (rash/itch) to materials, foods  Physical exam: There were no vitals taken for this visit. GENERAL APPEARANCE:  Well appearing, well developed, well nourished, NAD HEENT: Atraumatic, Normocephalic, oropharynx clear. NECK: Supple without lymphadenopathy or thyromegaly. LUNGS: Clear to auscultation bilaterally. HEART: Regular Rate and Rhythm without murmurs, gallops, or rubs. ABDOMEN: Soft, non-tender, No Masses. EXTREMITIES: Moves all extremities well.  Without clubbing, cyanosis, or edema. NEUROLOGIC:  Alert and oriented x 3, normal gait, CN II-XII grossly intact.  MENTAL STATUS:  Appropriate. BACK:  Non-tender to palpation.  No CVAT SKIN:  Warm, dry and intact.    Results: No results found for this or any previous visit (from the past 24 hour(s)).

## 2021-11-14 DEATH — deceased

## 2022-05-01 IMAGING — CT CT CHEST-ABD-PELV W/ CM
2 of 5 series · 9 of 36 positions shown, 15 images · IV contrast (Omnipaque or Isovue)
Comparison: 05/29/2020

CLINICAL DATA: Malignant melanoma with resection [DATE], below right
ear. Chronic lymphocytic leukemia. [place] cell. Hypertension.
Diabetes. COPD. Chemotherapy planning.

EXAM:
CT CHEST, ABDOMEN, AND PELVIS WITH CONTRAST
TECHNIQUE: Multidetector CT imaging of the chest, abdomen and pelvis was
performed following the standard protocol during bolus
administration of intravenous contrast.
CONTRAST:  100mL OMNIPAQUE IOHEXOL 300 MG/ML  SOLN

[Series 2: cap with · axial · 0.75mm/px · z∈[+922,+1396]mm · 6 of 134 slices shown, 11 images]
[im 20/134  mediastinal]
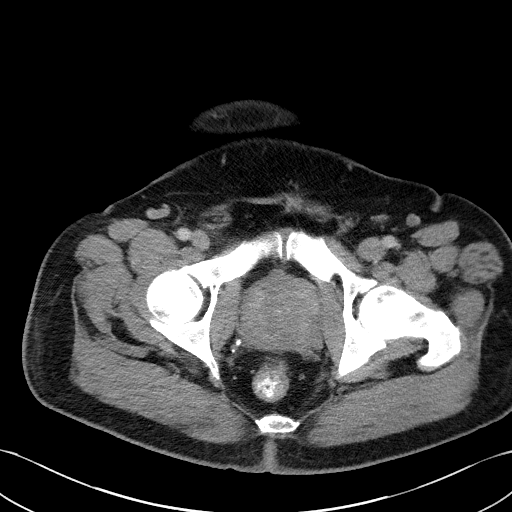
[im 20/134  bone]
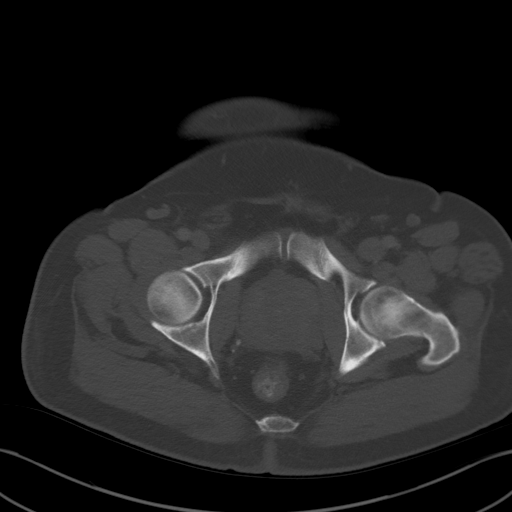
[im 39/134  mediastinal]
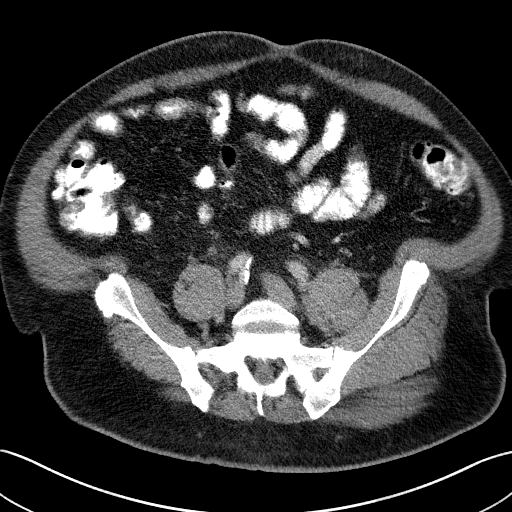
[im 58/134  mediastinal]
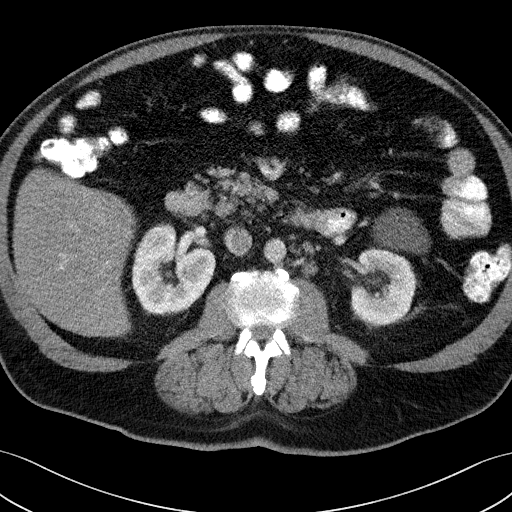
[im 58/134  lung]
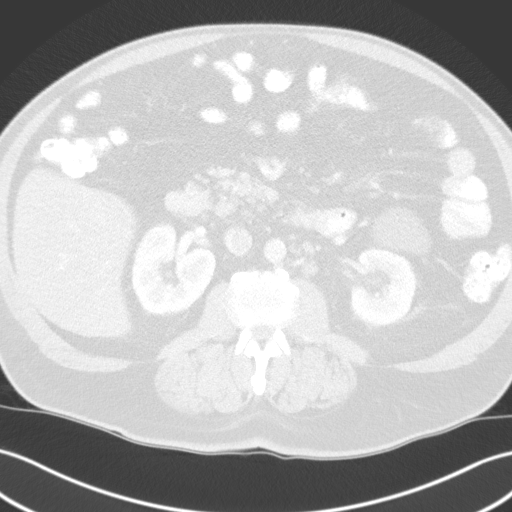
[im 77/134  mediastinal]
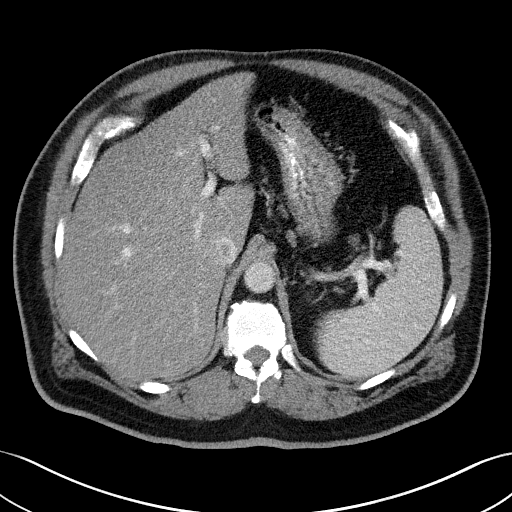
[im 77/134  lung]
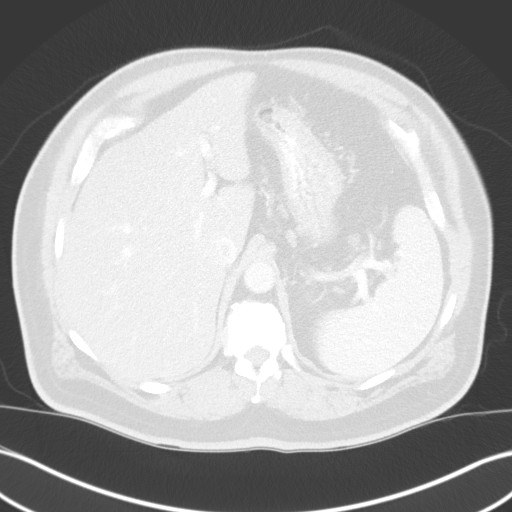
[im 96/134  mediastinal]
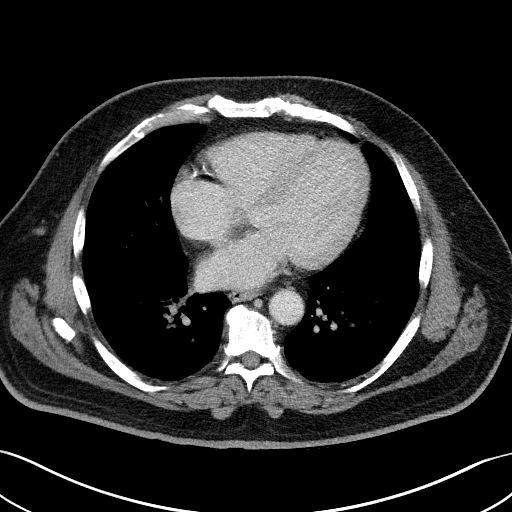
[im 96/134  lung]
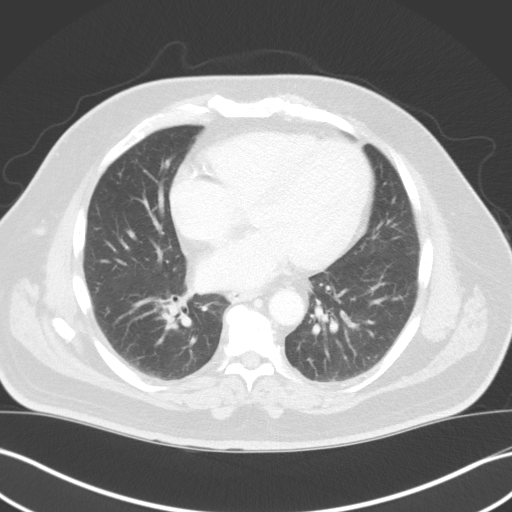
[im 115/134  mediastinal]
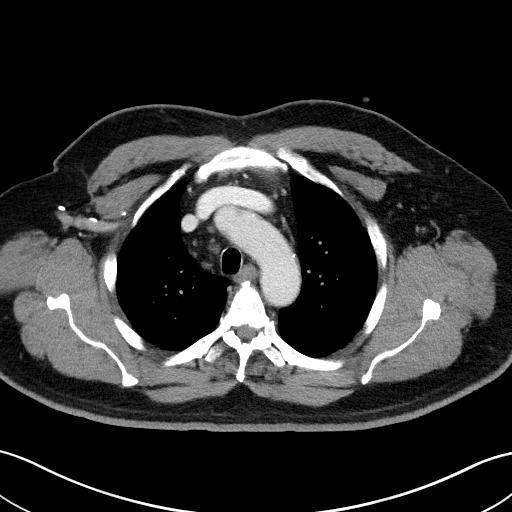
[im 115/134  lung]
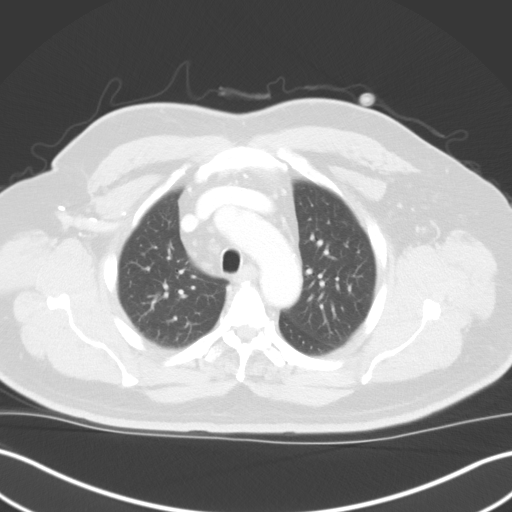

[Series 4: coronals · coronal · 0.85mm/px · 3 of 176 slices shown, 4 images]
[im 36/176  mediastinal]
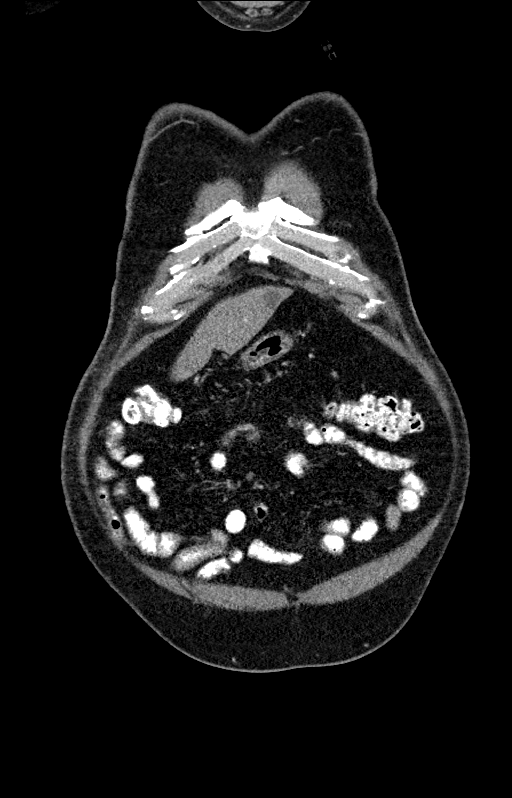
[im 71/176  mediastinal]
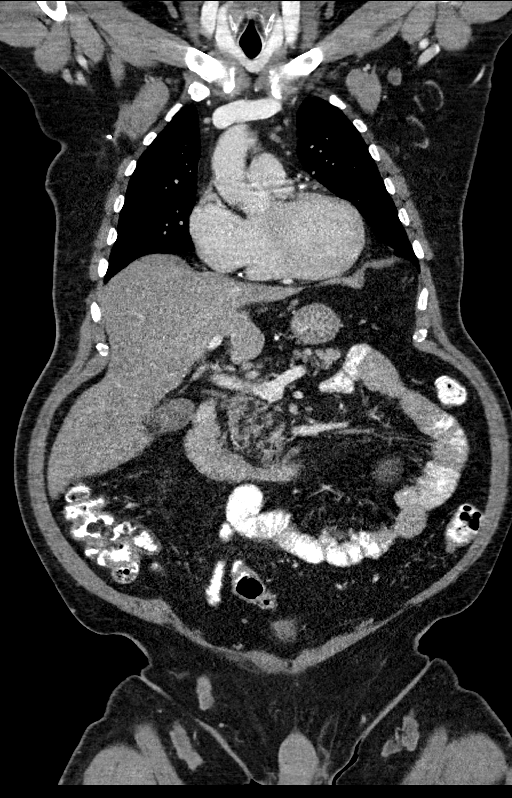
[im 71/176  bone]
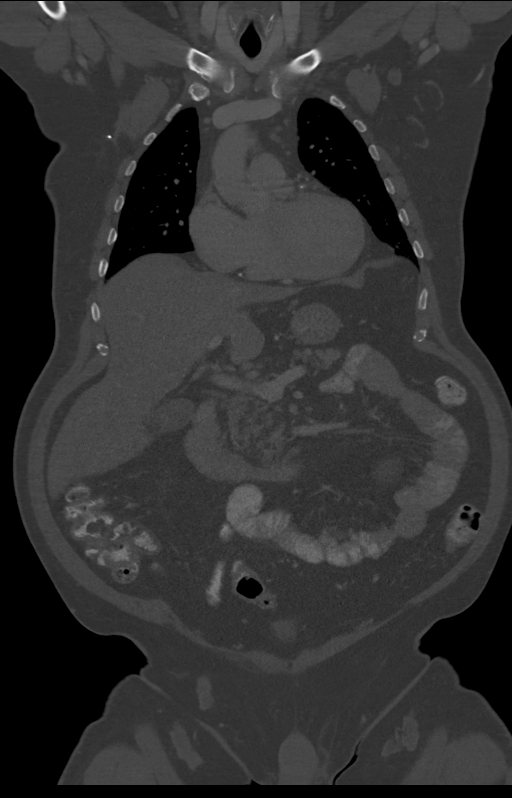
[im 106/176  mediastinal]
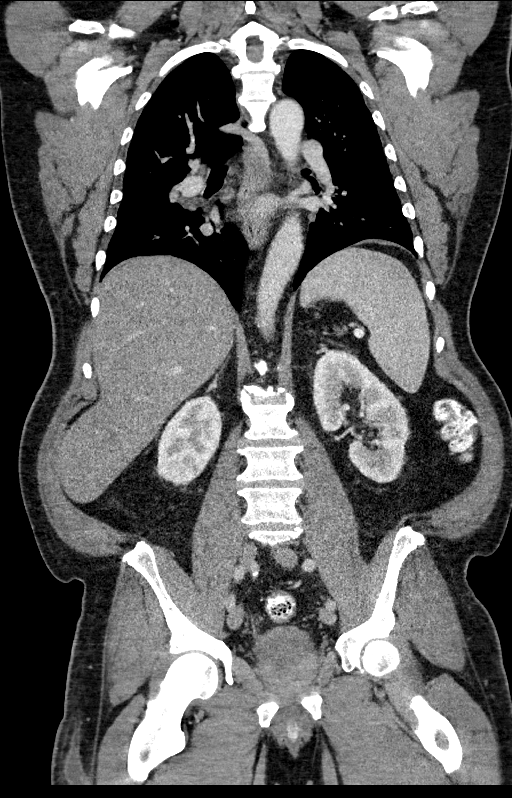

[9 of 36 positions shown; findings below may reference images not displayed]

FINDINGS: CT CHEST FINDINGS

Cardiovascular: Aortic atherosclerosis. Tortuous thoracic aorta.
Mild cardiomegaly, without pericardial effusion. Multivessel
coronary artery atherosclerosis. No central pulmonary embolism, on
this non-dedicated study.

Mediastinum/Nodes: Left-sided thyroid nodule of 8 mm is similar. Not
clinically significant; no follow-up imaging recommended (ref: [HOSPITAL]. [DATE]): 143-50).

No supraclavicular adenopathy. Right axillary node dissection. No
axillary adenopathy. No mediastinal or hilar adenopathy.

Lungs/Pleura: No pleural fluid. Scattered bilateral pulmonary
nodules are similar. Examples in the left lung base on images 104
and 105 at up to 6 mm. No new, enlarging, or suspicious pulmonary
nodules.

Musculoskeletal: No acute osseous abnormality.

CT ABDOMEN PELVIS FINDINGS

Hepatobiliary: Hepatic steatosis. Segment 2 hepatic cysts.
Hepatomegaly 22.1 cm craniocaudal. Similar nonspecific caudate lobe
enlargement. Normal gallbladder, without biliary ductal dilatation.

Pancreas: Normal, without mass or ductal dilatation.

Spleen: Splenic size of 13.4 x 8.4 by 12.0 cm (volume = 710 cm^3).
No focal lesion.

Adrenals/Urinary Tract: Normal adrenal glands.

Bilateral too small to characterize renal lesions. An exophytic
cm lower pole left renal cyst anteriorly.

The indeterminate lesion on the prior exam, within the interpolar
left kidney, is similar at 1.0 cm on 18/11/21. No hydronephrosis.
Decompressed urinary bladder.

Stomach/Bowel: Normal stomach, without wall thickening. Extensive
colonic diverticulosis. Normal terminal ileum. Normal small bowel.

Vascular/Lymphatic: Aortic atherosclerosis. Multiple small left
periaortic abdominal retroperitoneal nodes, maximally 7 mm on 82/2
versus 6 mm on the prior.

Portocaval node measures 1.4 cm on 70/2 and is similar to on the
prior exam (when remeasured).

Index gastrohepatic ligament node measures 11 mm on 65/2 and is
unchanged.

A node in the transverse mesocolon measures 11 mm on 67/2, similar.

Prominent bilateral inguinal nodes which are not pathologic by size
criteria.

Reproductive: Moderate prostatomegaly.

Other: No significant free fluid.

Musculoskeletal: No acute osseous abnormality.
IMPRESSION: 1. No significant change or evidence of progressive disease compared
to 05/29/2020.
2. Similar bilateral pulmonary nodules, consistent with a benign
etiology.
3. Hepatic steatosis and hepatomegaly. Chronic caudate lobe
enlargement for which mild cirrhosis cannot be excluded.
4. Similar abdominal adenopathy. This could be related to the
patient's history of chronic lymphocytic leukemia or be reactive in
the setting of liver disease.
5. Similar mild splenomegaly.
6. Coronary artery atherosclerosis. Aortic Atherosclerosis
([id number]-[id number]).
7. Prostatomegaly.
8. The previously described lesion of interest in the left kidney is
unchanged in size, remaining indeterminate.
